# Patient Record
Sex: Female | Born: 1945
Health system: Southern US, Community
[De-identification: ages and names within clinical notes are randomized; demographics above are authoritative.]

## PROBLEM LIST (undated history)

## (undated) DIAGNOSIS — D649 Anemia, unspecified: Secondary | ICD-10-CM

## (undated) DIAGNOSIS — F32A Depression, unspecified: Secondary | ICD-10-CM

## (undated) DIAGNOSIS — I809 Phlebitis and thrombophlebitis of unspecified site: Secondary | ICD-10-CM

## (undated) DIAGNOSIS — G4700 Insomnia, unspecified: Secondary | ICD-10-CM

## (undated) DIAGNOSIS — J3089 Other allergic rhinitis: Secondary | ICD-10-CM

## (undated) DIAGNOSIS — M1990 Unspecified osteoarthritis, unspecified site: Secondary | ICD-10-CM

## (undated) DIAGNOSIS — F329 Major depressive disorder, single episode, unspecified: Secondary | ICD-10-CM

## (undated) DIAGNOSIS — E559 Vitamin D deficiency, unspecified: Secondary | ICD-10-CM

## (undated) DIAGNOSIS — J189 Pneumonia, unspecified organism: Secondary | ICD-10-CM

## (undated) DIAGNOSIS — R0600 Dyspnea, unspecified: Secondary | ICD-10-CM

## (undated) DIAGNOSIS — I10 Essential (primary) hypertension: Secondary | ICD-10-CM

## (undated) DIAGNOSIS — K219 Gastro-esophageal reflux disease without esophagitis: Secondary | ICD-10-CM

## (undated) DIAGNOSIS — K5790 Diverticulosis of intestine, part unspecified, without perforation or abscess without bleeding: Secondary | ICD-10-CM

## (undated) DIAGNOSIS — Z8719 Personal history of other diseases of the digestive system: Secondary | ICD-10-CM

## (undated) HISTORY — PX: ABDOMINAL HYSTERECTOMY: SHX81

## (undated) HISTORY — PX: EYE SURGERY: SHX253

## (undated) HISTORY — PX: FOOT NEUROMA SURGERY: SHX646

## (undated) HISTORY — PX: OTHER SURGICAL HISTORY: SHX169

## (undated) HISTORY — PX: JOINT REPLACEMENT: SHX530

## (undated) HISTORY — PX: TONSILLECTOMY: SUR1361

## (undated) HISTORY — PX: SHOULDER ARTHROSCOPY W/ ROTATOR CUFF REPAIR: SHX2400

## (undated) HISTORY — PX: CORRECTION HAMMER TOE: SUR317

## (undated) HISTORY — PX: APPENDECTOMY: SHX54

---

## 2005-08-21 ENCOUNTER — Ambulatory Visit: Payer: Self-pay | Admitting: Internal Medicine

## 2006-08-23 ENCOUNTER — Ambulatory Visit: Payer: Self-pay | Admitting: Internal Medicine

## 2006-08-27 ENCOUNTER — Ambulatory Visit: Payer: Self-pay | Admitting: Gastroenterology

## 2007-08-26 ENCOUNTER — Ambulatory Visit: Payer: Self-pay | Admitting: Internal Medicine

## 2008-10-07 ENCOUNTER — Ambulatory Visit: Payer: Self-pay | Admitting: General Practice

## 2008-10-21 ENCOUNTER — Inpatient Hospital Stay: Payer: Self-pay | Admitting: General Practice

## 2008-12-16 ENCOUNTER — Ambulatory Visit: Payer: Self-pay | Admitting: Internal Medicine

## 2009-02-12 ENCOUNTER — Other Ambulatory Visit: Payer: Self-pay | Admitting: Internal Medicine

## 2009-07-28 ENCOUNTER — Ambulatory Visit: Payer: Self-pay | Admitting: Internal Medicine

## 2009-11-12 ENCOUNTER — Ambulatory Visit: Payer: Self-pay | Admitting: Gastroenterology

## 2009-12-02 ENCOUNTER — Ambulatory Visit: Payer: Self-pay | Admitting: Gastroenterology

## 2010-01-13 ENCOUNTER — Ambulatory Visit: Payer: Self-pay | Admitting: Gastroenterology

## 2010-08-04 ENCOUNTER — Ambulatory Visit: Payer: Self-pay | Admitting: Internal Medicine

## 2011-03-27 ENCOUNTER — Ambulatory Visit: Payer: Self-pay | Admitting: Gastroenterology

## 2011-03-28 LAB — PATHOLOGY REPORT

## 2011-08-15 ENCOUNTER — Ambulatory Visit: Payer: Self-pay | Admitting: Internal Medicine

## 2012-08-15 ENCOUNTER — Ambulatory Visit: Payer: Self-pay | Admitting: Internal Medicine

## 2013-08-18 ENCOUNTER — Ambulatory Visit: Payer: Self-pay

## 2013-11-25 ENCOUNTER — Ambulatory Visit (INDEPENDENT_AMBULATORY_CARE_PROVIDER_SITE_OTHER): Payer: Medicare Other | Admitting: Podiatry

## 2013-11-25 ENCOUNTER — Encounter: Payer: Self-pay | Admitting: Podiatry

## 2013-11-25 ENCOUNTER — Ambulatory Visit (INDEPENDENT_AMBULATORY_CARE_PROVIDER_SITE_OTHER): Payer: Medicare Other

## 2013-11-25 VITALS — BP 141/81 | HR 81 | Resp 16 | Ht 66.0 in | Wt 207.0 lb

## 2013-11-25 DIAGNOSIS — M79673 Pain in unspecified foot: Secondary | ICD-10-CM

## 2013-11-25 DIAGNOSIS — S93409A Sprain of unspecified ligament of unspecified ankle, initial encounter: Secondary | ICD-10-CM

## 2013-11-25 DIAGNOSIS — M79609 Pain in unspecified limb: Secondary | ICD-10-CM

## 2013-11-25 DIAGNOSIS — M7750 Other enthesopathy of unspecified foot: Secondary | ICD-10-CM

## 2013-11-25 DIAGNOSIS — M779 Enthesopathy, unspecified: Secondary | ICD-10-CM

## 2013-11-25 MED ORDER — TRIAMCINOLONE ACETONIDE 10 MG/ML IJ SUSP
10.0000 mg | Freq: Once | INTRAMUSCULAR | Status: AC
  Administered 2013-11-25: 10 mg

## 2013-11-25 NOTE — Progress Notes (Signed)
   Subjective:    Patient ID: Elizabeth Reed, female    DOB: Apr 01, 1946, 68 y.o.   MRN: [id number]  HPI Comments: Its my right foot. It swells and hurts all over. Different days it hurts in different places. Its been going on for 2 months. Its gotten worse over the months. i havent done anything for my foot.  Foot Pain      Review of Systems  Musculoskeletal:       Joint pain  All other systems reviewed and are negative.      Objective:   Physical Exam        Assessment & Plan:

## 2013-11-25 NOTE — Progress Notes (Signed)
Subjective:     Patient ID: Elizabeth Reed, female   DOB: 14-Feb-1946, 68 y.o.   MRN: [id number]  HPI patient states that the top of the right foot has been very sore and she's been walking differently and now the arch hurts and he ankle hurts on her right foot  Review of Systems     Objective:   Physical Exam Neurovascular status unchanged with inflammation and pain on the dorsum of the right midtarsal joint discomfort in the right fascia and discomfort in the sinus tarsi right with range of motion adequate but restricted and muscle strength within normal limits    Assessment:     Arthritis with inflammation dorsal right foot with probable compensation creating plantar fasciitis sprained ankle    Plan:     H&P and x-ray reviewed. Injected the dorsal tissue 3 mg Kenalog 5 mg Xylocaine Marcaine mixture dispensed fascially brace to lift the plantar arch and discussed possible new orthotics if symptoms persist

## 2013-12-16 ENCOUNTER — Ambulatory Visit: Payer: Medicare Other | Admitting: Podiatry

## 2014-08-19 ENCOUNTER — Ambulatory Visit: Payer: Self-pay

## 2015-04-19 ENCOUNTER — Other Ambulatory Visit: Payer: Self-pay | Admitting: Infectious Diseases

## 2015-04-19 ENCOUNTER — Ambulatory Visit
Admission: RE | Admit: 2015-04-19 | Discharge: 2015-04-19 | Disposition: A | Discharge status: Home / Self Care | Payer: PPO | Source: Ambulatory Visit | Attending: Infectious Diseases | Admitting: Infectious Diseases

## 2015-04-19 DIAGNOSIS — K8020 Calculus of gallbladder without cholecystitis without obstruction: Secondary | ICD-10-CM | POA: Insufficient documentation

## 2015-04-19 DIAGNOSIS — K8050 Calculus of bile duct without cholangitis or cholecystitis without obstruction: Secondary | ICD-10-CM | POA: Diagnosis present

## 2015-04-19 DIAGNOSIS — I10 Essential (primary) hypertension: Secondary | ICD-10-CM | POA: Diagnosis present

## 2015-05-04 ENCOUNTER — Encounter
Admission: RE | Admit: 2015-05-04 | Discharge: 2015-05-04 | Disposition: A | Discharge status: Home / Self Care | Payer: PPO | Source: Ambulatory Visit | Attending: Surgery | Admitting: Surgery

## 2015-05-04 DIAGNOSIS — Z01818 Encounter for other preprocedural examination: Secondary | ICD-10-CM | POA: Diagnosis present

## 2015-05-04 DIAGNOSIS — K8020 Calculus of gallbladder without cholecystitis without obstruction: Secondary | ICD-10-CM | POA: Insufficient documentation

## 2015-05-04 HISTORY — DX: Gastro-esophageal reflux disease without esophagitis: K21.9

## 2015-05-04 HISTORY — DX: Personal history of other diseases of the digestive system: Z87.19

## 2015-05-04 HISTORY — DX: Unspecified osteoarthritis, unspecified site: M19.90

## 2015-05-04 HISTORY — DX: Phlebitis and thrombophlebitis of unspecified site: I80.9

## 2015-05-04 HISTORY — DX: Essential (primary) hypertension: I10

## 2015-05-04 LAB — POTASSIUM: Potassium: 4.2 mmol/L (ref 3.5–5.1)

## 2015-05-04 NOTE — Patient Instructions (Signed)
  Your procedure is scheduled on: May 11, 2015(Tuesday) Report to Day Surgery. To find out your arrival time please call [phone] between 1PM - 3PM on May 10, 2015(Monday)  Remember: Instructions that are not followed completely may result in serious medical risk, up to and including death, or upon the discretion of your surgeon and anesthesiologist your surgery may need to be rescheduled.    __x__ 1. Do not eat food or drink liquids after midnight. No gum chewing or hard candies.     ____ 2. No Alcohol for 24 hours before or after surgery.   ____ 3. Bring all medications with you on the day of surgery if instructed.    __x__ 4. Notify your doctor if there is any change in your medical condition     (cold, fever, infections).     Do not wear jewelry, make-up, hairpins, clips or nail polish.  Do not wear lotions, powders, or perfumes. You may wear deodorant.  Do not shave 48 hours prior to surgery. Men may shave face and neck.  Do not bring valuables to the hospital.    [facility] is not responsible for any belongings or valuables.               Contacts, dentures or bridgework may not be worn into surgery.  Leave your suitcase in the car. After surgery it may be brought to your room.  For patients admitted to the hospital, discharge time is determined by your                treatment team.   Patients discharged the day of surgery will not be allowed to drive home.   Please read over the following fact sheets that you were given:   Surgical Site Infection Prevention   _x___ Take these medicines the morning of surgery with A SIP OF WATER:    1. Pantoprazole (Pantoprazole at bedtime Monday night)  2.   3.   4.  5.  6.  ____ Fleet Enema (as directed)   __x__ Use CHG Soap as directed  ____ Use inhalers on the day of surgery  ____ Stop metformin 2 days prior to surgery    ____ Take 1/2 of usual insulin dose the night before surgery and none on the morning of  surgery.   __x__ Stop Coumadin/Plavix/aspirin on (Patient has stopped Aspirin)  ____ Stop Anti-inflammatories on    ____ Stop supplements until after surgery.    ____ Bring C-Pap to the hospital.

## 2015-05-11 ENCOUNTER — Encounter: Payer: Self-pay | Admitting: *Deleted

## 2015-05-11 ENCOUNTER — Ambulatory Visit
Admission: RE | Admit: 2015-05-11 | Discharge: 2015-05-11 | Disposition: A | Discharge status: Home / Self Care | Payer: PPO | Source: Ambulatory Visit | Attending: Surgery | Admitting: Surgery

## 2015-05-11 ENCOUNTER — Ambulatory Visit: Payer: PPO | Admitting: Anesthesiology

## 2015-05-11 ENCOUNTER — Encounter
Admission: RE | Disposition: A | Discharge status: Home / Self Care | Payer: Self-pay | Source: Ambulatory Visit | Attending: Surgery

## 2015-05-11 ENCOUNTER — Ambulatory Visit: Payer: PPO

## 2015-05-11 DIAGNOSIS — K449 Diaphragmatic hernia without obstruction or gangrene: Secondary | ICD-10-CM | POA: Insufficient documentation

## 2015-05-11 DIAGNOSIS — G4700 Insomnia, unspecified: Secondary | ICD-10-CM | POA: Diagnosis not present

## 2015-05-11 DIAGNOSIS — K8010 Calculus of gallbladder with chronic cholecystitis without obstruction: Secondary | ICD-10-CM | POA: Insufficient documentation

## 2015-05-11 DIAGNOSIS — Z96653 Presence of artificial knee joint, bilateral: Secondary | ICD-10-CM | POA: Insufficient documentation

## 2015-05-11 DIAGNOSIS — F329 Major depressive disorder, single episode, unspecified: Secondary | ICD-10-CM | POA: Diagnosis not present

## 2015-05-11 DIAGNOSIS — K219 Gastro-esophageal reflux disease without esophagitis: Secondary | ICD-10-CM | POA: Insufficient documentation

## 2015-05-11 DIAGNOSIS — K8020 Calculus of gallbladder without cholecystitis without obstruction: Secondary | ICD-10-CM

## 2015-05-11 DIAGNOSIS — M1990 Unspecified osteoarthritis, unspecified site: Secondary | ICD-10-CM | POA: Diagnosis not present

## 2015-05-11 DIAGNOSIS — Z7982 Long term (current) use of aspirin: Secondary | ICD-10-CM | POA: Diagnosis not present

## 2015-05-11 DIAGNOSIS — E559 Vitamin D deficiency, unspecified: Secondary | ICD-10-CM | POA: Diagnosis not present

## 2015-05-11 DIAGNOSIS — I10 Essential (primary) hypertension: Secondary | ICD-10-CM | POA: Diagnosis not present

## 2015-05-11 HISTORY — PX: CHOLECYSTECTOMY: SHX55

## 2015-05-11 SURGERY — LAPAROSCOPIC CHOLECYSTECTOMY
Anesthesia: General | Wound class: Clean

## 2015-05-11 MED ORDER — SUCCINYLCHOLINE CHLORIDE 20 MG/ML IJ SOLN
INTRAMUSCULAR | Status: DC | PRN
Start: 2015-05-11 — End: 2015-05-11
  Administered 2015-05-11: 100 mg via INTRAVENOUS

## 2015-05-11 MED ORDER — OXYCODONE HCL 5 MG/5ML PO SOLN: 5.0000 mg | Freq: Once | ORAL | Status: DC | PRN

## 2015-05-11 MED ORDER — LIDOCAINE HCL (CARDIAC) 20 MG/ML IV SOLN
INTRAVENOUS | Status: DC | PRN
Start: 2015-05-11 — End: 2015-05-11
  Administered 2015-05-11: 40 mg via INTRAVENOUS

## 2015-05-11 MED ORDER — DEXTROSE 5 % IV SOLN
2.0000 g | INTRAVENOUS | Status: DC | PRN
  Administered 2015-05-11: 2 g via INTRAVENOUS

## 2015-05-11 MED ORDER — SODIUM CHLORIDE 0.9 % IV SOLN
INTRAVENOUS | Status: DC | PRN
  Administered 2015-05-11: 1 mL via INTRAMUSCULAR

## 2015-05-11 MED ORDER — SUGAMMADEX SODIUM 200 MG/2ML IV SOLN
INTRAVENOUS | Status: DC | PRN
  Administered 2015-05-11: 149.6 mg via INTRAVENOUS

## 2015-05-11 MED ORDER — DEXTROSE 5 % IV SOLN
2.0000 g | Freq: Once | INTRAVENOUS | Status: AC
  Administered 2015-05-11: 2 g via INTRAVENOUS
  Filled 2015-05-11: qty 2

## 2015-05-11 MED ORDER — HYDROCODONE-ACETAMINOPHEN 5-325 MG PO TABS
1.0000 | ORAL_TABLET | ORAL | Status: DC | PRN
  Administered 2015-05-11: 1 via ORAL

## 2015-05-11 MED ORDER — HYDROCODONE-ACETAMINOPHEN 5-325 MG PO TABS: 1.0000 | ORAL_TABLET | ORAL | Status: DC | PRN

## 2015-05-11 MED ORDER — ROCURONIUM BROMIDE 100 MG/10ML IV SOLN
INTRAVENOUS | Status: DC | PRN
  Administered 2015-05-11: 5 mg via INTRAVENOUS
  Administered 2015-05-11: 35 mg via INTRAVENOUS

## 2015-05-11 MED ORDER — ONDANSETRON HCL 4 MG/2ML IJ SOLN: 4.0000 mg | Freq: Once | INTRAMUSCULAR | Status: DC | PRN

## 2015-05-11 MED ORDER — OXYCODONE HCL 5 MG PO TABS: 5.0000 mg | ORAL_TABLET | Freq: Once | ORAL | Status: DC | PRN

## 2015-05-11 MED ORDER — FENTANYL CITRATE (PF) 100 MCG/2ML IJ SOLN: 25.0000 ug | INTRAMUSCULAR | Status: DC | PRN

## 2015-05-11 MED ORDER — PHENYLEPHRINE HCL 10 MG/ML IJ SOLN
INTRAMUSCULAR | Status: DC | PRN
  Administered 2015-05-11 (×2): 100 ug via INTRAVENOUS

## 2015-05-11 MED ORDER — MIDAZOLAM HCL 2 MG/2ML IJ SOLN
INTRAMUSCULAR | Status: DC | PRN
  Administered 2015-05-11: 1 mg via INTRAVENOUS

## 2015-05-11 MED ORDER — PROPOFOL 10 MG/ML IV BOLUS
INTRAVENOUS | Status: DC | PRN
  Administered 2015-05-11: 140 mg via INTRAVENOUS

## 2015-05-11 MED ORDER — FENTANYL CITRATE (PF) 100 MCG/2ML IJ SOLN
25.0000 ug | INTRAMUSCULAR | Status: DC | PRN
  Administered 2015-05-11 (×4): 25 ug via INTRAVENOUS

## 2015-05-11 MED ORDER — LACTATED RINGERS IV SOLN
INTRAVENOUS | Status: DC
  Administered 2015-05-11 (×2): via INTRAVENOUS

## 2015-05-11 MED ORDER — HEPARIN SODIUM (PORCINE) 5000 UNIT/ML IJ SOLN
INTRAMUSCULAR | Status: AC
  Filled 2015-05-11: qty 1

## 2015-05-11 MED ORDER — ONDANSETRON HCL 4 MG/2ML IJ SOLN
INTRAMUSCULAR | Status: DC | PRN
  Administered 2015-05-11: 4 mg via INTRAVENOUS

## 2015-05-11 MED ORDER — HYDROCODONE-ACETAMINOPHEN 5-325 MG PO TABS
ORAL_TABLET | ORAL | Status: AC
  Filled 2015-05-11: qty 1

## 2015-05-11 MED ORDER — FENTANYL CITRATE (PF) 100 MCG/2ML IJ SOLN
INTRAMUSCULAR | Status: DC | PRN
  Administered 2015-05-11: 50 ug via INTRAVENOUS
  Administered 2015-05-11: 25 ug via INTRAVENOUS
  Administered 2015-05-11: 50 ug via INTRAVENOUS

## 2015-05-11 MED ORDER — DEXAMETHASONE SODIUM PHOSPHATE 4 MG/ML IJ SOLN
INTRAMUSCULAR | Status: DC | PRN
  Administered 2015-05-11: 8 mg via INTRAVENOUS

## 2015-05-11 MED ORDER — FENTANYL CITRATE (PF) 100 MCG/2ML IJ SOLN
INTRAMUSCULAR | Status: AC
  Administered 2015-05-11: 25 ug via INTRAVENOUS
  Filled 2015-05-11: qty 2

## 2015-05-11 SURGICAL SUPPLY — 35 items
APPLIER CLIP ROT 10 11.4 M/L (STAPLE) ×3
CANISTER SUCT 1200ML W/VALVE (MISCELLANEOUS) ×3 IMPLANT
CANNULA DILATOR 10 W/SLV (CANNULA) ×2 IMPLANT
CANNULA DILATOR 10MM W/SLV (CANNULA) ×1
CATH REDDICK CHOLANGI 4FR 50CM (CATHETERS) ×3 IMPLANT
CHLORAPREP W/TINT 26ML (MISCELLANEOUS) ×3 IMPLANT
CLIP APPLIE ROT 10 11.4 M/L (STAPLE) ×1 IMPLANT
CLOSURE WOUND 1/2 X4 (GAUZE/BANDAGES/DRESSINGS) ×1
DRAPE SHEET LG 3/4 BI-LAMINATE (DRAPES) ×3 IMPLANT
GAUZE SPONGE 4X4 12PLY STRL (GAUZE/BANDAGES/DRESSINGS) ×3 IMPLANT
GLOVE BIO SURGEON STRL SZ7.5 (GLOVE) ×3 IMPLANT
GOWN STRL REUS W/ TWL LRG LVL3 (GOWN DISPOSABLE) ×4 IMPLANT
GOWN STRL REUS W/TWL LRG LVL3 (GOWN DISPOSABLE) ×8
IRRIGATION STRYKERFLOW (MISCELLANEOUS) ×1 IMPLANT
IRRIGATOR STRYKERFLOW (MISCELLANEOUS) ×3
IV NS 1000ML (IV SOLUTION) ×2
IV NS 1000ML BAXH (IV SOLUTION) ×1 IMPLANT
KIT RM TURNOVER STRD PROC AR (KITS) ×3 IMPLANT
LABEL OR SOLS (LABEL) ×3 IMPLANT
NEEDLE FILTER BLUNT 18X 1/2SAF (NEEDLE) ×2
NEEDLE FILTER BLUNT 18X1 1/2 (NEEDLE) ×1 IMPLANT
NS IRRIG 500ML POUR BTL (IV SOLUTION) ×3 IMPLANT
PACK LAP CHOLECYSTECTOMY (MISCELLANEOUS) ×3 IMPLANT
PAD GROUND ADULT SPLIT (MISCELLANEOUS) ×3 IMPLANT
SCISSORS METZENBAUM CVD 33 (INSTRUMENTS) ×3 IMPLANT
SEAL FOR SCOPE WARMER C3101 (MISCELLANEOUS) ×3 IMPLANT
SLEEVE ENDOPATH XCEL 5M (ENDOMECHANICALS) ×3 IMPLANT
STRIP CLOSURE SKIN 1/2X4 (GAUZE/BANDAGES/DRESSINGS) ×2 IMPLANT
SUT CHROMIC 5 0 RB 1 27 (SUTURE) ×3 IMPLANT
SUT VIC AB 0 CT2 27 (SUTURE) IMPLANT
SYR 3ML LL SCALE MARK (SYRINGE) ×3 IMPLANT
TROCAR XCEL NON-BLD 11X100MML (ENDOMECHANICALS) ×3 IMPLANT
TROCAR XCEL NON-BLD 5MMX100MML (ENDOMECHANICALS) ×3 IMPLANT
TUBING INSUFFLATOR HI FLOW (MISCELLANEOUS) ×3 IMPLANT
WATER STERILE IRR 1000ML POUR (IV SOLUTION) ×3 IMPLANT

## 2015-05-11 NOTE — Anesthesia Postprocedure Evaluation (Signed)
  Anesthesia Post-op Note  Patient: Elizabeth Reed  Procedure(s) Performed: Procedure(s): LAPAROSCOPIC CHOLECYSTECTOMY (N/A)  Anesthesia type:General ETT  Patient location: PACU  Post pain: Pain level controlled  Post assessment: Post-op Vital signs reviewed, Patient's Cardiovascular Status Stable, Respiratory Function Stable, Patent Airway and No signs of Nausea or vomiting  Post vital signs: Reviewed and stable  Last Vitals:  Filed Vitals:   05/11/15 1234  BP: 132/73  Pulse: 67  Temp: 37.8 C  Resp: 9    Level of consciousness: awake, alert  and patient cooperative  Complications: No apparent anesthesia complications

## 2015-05-11 NOTE — Anesthesia Procedure Notes (Signed)
Procedure Name: Intubation Date/Time: 05/11/2015 10:03 AM Performed by: Allean Found Pre-anesthesia Checklist: Patient identified, Emergency Drugs available, Suction available, Patient being monitored and Timeout performed Patient Re-evaluated:Patient Re-evaluated prior to inductionOxygen Delivery Method: Circle system utilized Preoxygenation: Pre-oxygenation with 100% oxygen Intubation Type: IV induction Laryngoscope Size: Mac and 3 Grade View: Grade I Tube type: Oral Number of attempts: 1 Airway Equipment and Method: Stylet Placement Confirmation: ETT inserted through vocal cords under direct vision,  positive ETCO2 and breath sounds checked- equal and bilateral Secured at: 21 cm Tube secured with: Tape Dental Injury: Teeth and Oropharynx as per pre-operative assessment

## 2015-05-11 NOTE — Discharge Instructions (Addendum)
Take Tylenol or Norco if needed for pain. Resume aspirin on Thursday. Remove dressings Wednesday. May shower Thursday. Avoid straining and heavy lifting in the first week after surgery.   AMBULATORY SURGERY  DISCHARGE INSTRUCTIONS   1) The drugs that you were given will stay in your system until tomorrow so for the next 24 hours you should not:  A) Drive an automobile B) Make any legal decisions C) Drink any alcoholic beverage   2) You may resume regular meals tomorrow.  Today it is better to start with liquids and gradually work up to solid foods.  You may eat anything you prefer, but it is better to start with liquids, then soup and crackers, and gradually work up to solid foods.   3) Please notify your doctor immediately if you have any unusual bleeding, trouble breathing, redness and pain at the surgery site, drainage, fever, or pain not relieved by medication.    4) Additional Instructions:        Please contact your physician with any problems or Same Day Surgery at [phone], Monday through Friday 6 am to 4 pm, or [place] at [facility] number at [phone].

## 2015-05-11 NOTE — Op Note (Signed)
OPERATIVE REPORT  PREOPERATIVE DIAGNOSIS:  Chronic cholecystitis cholelithiasis  POSTOPERATIVE DIAGNOSIS: Chronic cholecystitis cholelithiasis  PROCEDURE: Laparoscopic cholecystectomy with cholangiogram  ANESTHESIA: General  SURGEON: Rochel Brome M.D.  INDICATIONS: She has a history of severe epigastric pains and ultrasound findings of gallstones    With the patient on the operating table in the supine position under general endotracheal anesthesia the abdomen was prepared with ChloraPrep solution and draped in a sterile manner. A short incision was made in the inferior aspect of the umbilicus and carried down to the deep fascia which was grasped with a laryngeal hook. The Veress needle was inserted aspirated and irrigated with a saline solution. The abdominal cavity was insufflated with carbon dioxide. The Veress needle was removed. The 10 mm cannula was inserted. The 10 mm 0 laparoscope was inserted to view the peritoneal cavity.  Another incision was made in the epigastrium slightly to the right of the midline to introduce an 11 mm cannula. 2 incisions were made in the lateral aspect of the right upper quadrant to introduce 2   5 mm cannulas. The gallbladder was retracted towards the right shoulder.   The gallbladder neck was retracted inferiorly and laterally.  The porta hepatis was identified. The gallbladder was mobilized with incision of the visceral peritoneum. The cystic duct was dissected free from surrounding structures. The cystic artery was dissected free from surrounding structures. A critical view of safety was demonstrated  An Endo Clip was placed across the cystic duct adjacent to the gallbladder neck. An incision was made in the cystic duct to introduce a Reddick catheter. The Reddick catheter would not thread into the cystic duct. Therefore a cholangiogram was not done The Reddick catheter was removed. The cystic duct was doubly ligated with endoclips and divided. The cystic  artery was controlled with double endoclips and divided. The gallbladder was dissected free from the liver with use of hook and cautery and blunt dissection. Bleeding was minimal and hemostasis was intact. The gallbladder was delivered up through the infraumbilical incision opened and suctioned. The gallbladder  was removed and identified tiny stones. The gallbladder was submitted in formalin for routine pathology. The skin incisions were closed with interrupted 5-0 chromic subcutaneous suture benzoin and Steri-Strips. Gauze dressings were applied with paper tape.  The patient appeared to be in satisfactory condition and was prepared for transfer to the recovery room  [place].D.

## 2015-05-11 NOTE — H&P (Signed)
  She reports no change in condition since the day of the office visit.  I discussed the plan for laparoscopic cholecystectomy.

## 2015-05-11 NOTE — Transfer of Care (Signed)
Immediate Anesthesia Transfer of Care Note  Patient: Elizabeth Reed  Procedure(s) Performed: Procedure(s): LAPAROSCOPIC CHOLECYSTECTOMY (N/A)  Patient Location: PACU  Anesthesia Type:General  Level of Consciousness: awake  Airway & Oxygen Therapy: Patient Spontanous Breathing  Post-op Assessment: Report given to RN and Post -op Vital signs reviewed and stable  Post vital signs: stable  Last Vitals:  Filed Vitals:   05/11/15 1149  BP: 158/90  Pulse: 74  Temp: 35.6 C  Resp: 14    Complications: No apparent anesthesia complications

## 2015-05-11 NOTE — Anesthesia Preprocedure Evaluation (Addendum)
Anesthesia Evaluation  Patient identified by MRN, date of birth, ID band Patient awake    Reviewed: Allergy & Precautions, H&P , NPO status , Patient's Chart, lab work & pertinent test results  History of Anesthesia Complications Negative for: history of anesthetic complications  Airway Mallampati: II  TM Distance: <3 FB Neck ROM: limited    Dental  (+) Poor Dentition, Missing, Upper Dentures, Lower Dentures   Pulmonary neg pulmonary ROS, neg shortness of breath,    Pulmonary exam normal breath sounds clear to auscultation       Cardiovascular Exercise Tolerance: Good hypertension, (-) angina(-) DOE Normal cardiovascular exam Rhythm:regular Rate:Normal     Neuro/Psych negative neurological ROS  negative psych ROS   GI/Hepatic Neg liver ROS, hiatal hernia, GERD  Controlled,  Endo/Other  negative endocrine ROS  Renal/GU negative Renal ROS  negative genitourinary   Musculoskeletal   Abdominal   Peds  Hematology negative hematology ROS (+)   Anesthesia Other Findings Past Medical History:   Hypertension                                                 GERD (gastroesophageal reflux disease)                         Comment:erosive esophagus   History of hiatal hernia                                     Arthritis                                                    Blood clot associated with vein wall inflammat*                Comment:Left Leg  Past Surgical History:   ABDOMINAL HYSTERECTOMY                                        JOINT REPLACEMENT                               Bilateral 2004, 2010     Comment:Total Knee Replacement   APPENDECTOMY                                                  TONSILLECTOMY                                                 SHOULDER ARTHROSCOPY W/ ROTATOR CUFF REPAIR     Left             BMI    Body Mass Index   26.64 kg/m 2      Reproductive/Obstetrics  negative OB ROS                             Anesthesia Physical Anesthesia Plan  ASA: III  Anesthesia Plan: General ETT   Post-op Pain Management:    Induction:   Airway Management Planned: Oral ETT  Additional Equipment:   Intra-op Plan:   Post-operative Plan: Extubation in OR  Informed Consent: I have reviewed the patients History and Physical, chart, labs and discussed the procedure including the risks, benefits and alternatives for the proposed anesthesia with the patient or authorized representative who has indicated his/her understanding and acceptance.   Dental Advisory Given  Plan Discussed with: Anesthesiologist, CRNA and Surgeon  Anesthesia Plan Comments:        Anesthesia Quick Evaluation

## 2015-05-13 LAB — SURGICAL PATHOLOGY

## 2015-07-27 DIAGNOSIS — M5134 Other intervertebral disc degeneration, thoracic region: Secondary | ICD-10-CM | POA: Diagnosis not present

## 2015-07-27 DIAGNOSIS — M9903 Segmental and somatic dysfunction of lumbar region: Secondary | ICD-10-CM | POA: Diagnosis not present

## 2015-07-27 DIAGNOSIS — M9902 Segmental and somatic dysfunction of thoracic region: Secondary | ICD-10-CM | POA: Diagnosis not present

## 2015-07-27 DIAGNOSIS — M5416 Radiculopathy, lumbar region: Secondary | ICD-10-CM | POA: Diagnosis not present

## 2015-08-24 DIAGNOSIS — M9903 Segmental and somatic dysfunction of lumbar region: Secondary | ICD-10-CM | POA: Diagnosis not present

## 2015-08-24 DIAGNOSIS — M5134 Other intervertebral disc degeneration, thoracic region: Secondary | ICD-10-CM | POA: Diagnosis not present

## 2015-08-24 DIAGNOSIS — M5416 Radiculopathy, lumbar region: Secondary | ICD-10-CM | POA: Diagnosis not present

## 2015-08-24 DIAGNOSIS — M9902 Segmental and somatic dysfunction of thoracic region: Secondary | ICD-10-CM | POA: Diagnosis not present

## 2015-08-27 ENCOUNTER — Other Ambulatory Visit: Payer: Self-pay | Admitting: Infectious Diseases

## 2015-08-27 DIAGNOSIS — Z1231 Encounter for screening mammogram for malignant neoplasm of breast: Secondary | ICD-10-CM

## 2015-09-02 ENCOUNTER — Ambulatory Visit
Admission: RE | Admit: 2015-09-02 | Discharge: 2015-09-02 | Disposition: A | Discharge status: Home / Self Care | Payer: PPO | Source: Ambulatory Visit | Attending: Infectious Diseases | Admitting: Infectious Diseases

## 2015-09-02 DIAGNOSIS — Z1231 Encounter for screening mammogram for malignant neoplasm of breast: Secondary | ICD-10-CM | POA: Insufficient documentation

## 2015-09-21 DIAGNOSIS — M9902 Segmental and somatic dysfunction of thoracic region: Secondary | ICD-10-CM | POA: Diagnosis not present

## 2015-09-21 DIAGNOSIS — M5134 Other intervertebral disc degeneration, thoracic region: Secondary | ICD-10-CM | POA: Diagnosis not present

## 2015-09-21 DIAGNOSIS — M9903 Segmental and somatic dysfunction of lumbar region: Secondary | ICD-10-CM | POA: Diagnosis not present

## 2015-09-21 DIAGNOSIS — M5416 Radiculopathy, lumbar region: Secondary | ICD-10-CM | POA: Diagnosis not present

## 2015-10-19 DIAGNOSIS — M9903 Segmental and somatic dysfunction of lumbar region: Secondary | ICD-10-CM | POA: Diagnosis not present

## 2015-10-19 DIAGNOSIS — M5416 Radiculopathy, lumbar region: Secondary | ICD-10-CM | POA: Diagnosis not present

## 2015-10-19 DIAGNOSIS — M5134 Other intervertebral disc degeneration, thoracic region: Secondary | ICD-10-CM | POA: Diagnosis not present

## 2015-10-19 DIAGNOSIS — M9902 Segmental and somatic dysfunction of thoracic region: Secondary | ICD-10-CM | POA: Diagnosis not present

## 2015-11-16 DIAGNOSIS — M5416 Radiculopathy, lumbar region: Secondary | ICD-10-CM | POA: Diagnosis not present

## 2015-11-16 DIAGNOSIS — M9903 Segmental and somatic dysfunction of lumbar region: Secondary | ICD-10-CM | POA: Diagnosis not present

## 2015-11-16 DIAGNOSIS — M9902 Segmental and somatic dysfunction of thoracic region: Secondary | ICD-10-CM | POA: Diagnosis not present

## 2015-11-16 DIAGNOSIS — M5134 Other intervertebral disc degeneration, thoracic region: Secondary | ICD-10-CM | POA: Diagnosis not present

## 2015-12-14 DIAGNOSIS — M5134 Other intervertebral disc degeneration, thoracic region: Secondary | ICD-10-CM | POA: Diagnosis not present

## 2015-12-14 DIAGNOSIS — M9902 Segmental and somatic dysfunction of thoracic region: Secondary | ICD-10-CM | POA: Diagnosis not present

## 2015-12-14 DIAGNOSIS — M5416 Radiculopathy, lumbar region: Secondary | ICD-10-CM | POA: Diagnosis not present

## 2015-12-14 DIAGNOSIS — M9903 Segmental and somatic dysfunction of lumbar region: Secondary | ICD-10-CM | POA: Diagnosis not present

## 2016-01-11 DIAGNOSIS — M5416 Radiculopathy, lumbar region: Secondary | ICD-10-CM | POA: Diagnosis not present

## 2016-01-11 DIAGNOSIS — M9903 Segmental and somatic dysfunction of lumbar region: Secondary | ICD-10-CM | POA: Diagnosis not present

## 2016-01-11 DIAGNOSIS — M5134 Other intervertebral disc degeneration, thoracic region: Secondary | ICD-10-CM | POA: Diagnosis not present

## 2016-01-11 DIAGNOSIS — M9902 Segmental and somatic dysfunction of thoracic region: Secondary | ICD-10-CM | POA: Diagnosis not present

## 2016-01-31 DIAGNOSIS — H40039 Anatomical narrow angle, unspecified eye: Secondary | ICD-10-CM | POA: Diagnosis not present

## 2016-02-04 DIAGNOSIS — H40039 Anatomical narrow angle, unspecified eye: Secondary | ICD-10-CM | POA: Diagnosis not present

## 2016-02-11 DIAGNOSIS — H35372 Puckering of macula, left eye: Secondary | ICD-10-CM | POA: Diagnosis not present

## 2016-02-15 DIAGNOSIS — M9903 Segmental and somatic dysfunction of lumbar region: Secondary | ICD-10-CM | POA: Diagnosis not present

## 2016-02-15 DIAGNOSIS — M9902 Segmental and somatic dysfunction of thoracic region: Secondary | ICD-10-CM | POA: Diagnosis not present

## 2016-02-15 DIAGNOSIS — M5134 Other intervertebral disc degeneration, thoracic region: Secondary | ICD-10-CM | POA: Diagnosis not present

## 2016-02-15 DIAGNOSIS — M5416 Radiculopathy, lumbar region: Secondary | ICD-10-CM | POA: Diagnosis not present

## 2016-03-14 DIAGNOSIS — M9903 Segmental and somatic dysfunction of lumbar region: Secondary | ICD-10-CM | POA: Diagnosis not present

## 2016-03-14 DIAGNOSIS — M9902 Segmental and somatic dysfunction of thoracic region: Secondary | ICD-10-CM | POA: Diagnosis not present

## 2016-03-14 DIAGNOSIS — M5134 Other intervertebral disc degeneration, thoracic region: Secondary | ICD-10-CM | POA: Diagnosis not present

## 2016-03-14 DIAGNOSIS — M5416 Radiculopathy, lumbar region: Secondary | ICD-10-CM | POA: Diagnosis not present

## 2016-04-11 DIAGNOSIS — M5134 Other intervertebral disc degeneration, thoracic region: Secondary | ICD-10-CM | POA: Diagnosis not present

## 2016-04-11 DIAGNOSIS — M9902 Segmental and somatic dysfunction of thoracic region: Secondary | ICD-10-CM | POA: Diagnosis not present

## 2016-04-11 DIAGNOSIS — M9903 Segmental and somatic dysfunction of lumbar region: Secondary | ICD-10-CM | POA: Diagnosis not present

## 2016-04-11 DIAGNOSIS — M5416 Radiculopathy, lumbar region: Secondary | ICD-10-CM | POA: Diagnosis not present

## 2016-05-09 DIAGNOSIS — M5134 Other intervertebral disc degeneration, thoracic region: Secondary | ICD-10-CM | POA: Diagnosis not present

## 2016-05-09 DIAGNOSIS — M9903 Segmental and somatic dysfunction of lumbar region: Secondary | ICD-10-CM | POA: Diagnosis not present

## 2016-05-09 DIAGNOSIS — M9902 Segmental and somatic dysfunction of thoracic region: Secondary | ICD-10-CM | POA: Diagnosis not present

## 2016-05-09 DIAGNOSIS — M5416 Radiculopathy, lumbar region: Secondary | ICD-10-CM | POA: Diagnosis not present

## 2016-06-06 DIAGNOSIS — M5134 Other intervertebral disc degeneration, thoracic region: Secondary | ICD-10-CM | POA: Diagnosis not present

## 2016-06-06 DIAGNOSIS — M9902 Segmental and somatic dysfunction of thoracic region: Secondary | ICD-10-CM | POA: Diagnosis not present

## 2016-06-06 DIAGNOSIS — M9903 Segmental and somatic dysfunction of lumbar region: Secondary | ICD-10-CM | POA: Diagnosis not present

## 2016-06-06 DIAGNOSIS — M5416 Radiculopathy, lumbar region: Secondary | ICD-10-CM | POA: Diagnosis not present

## 2016-06-26 DIAGNOSIS — E559 Vitamin D deficiency, unspecified: Secondary | ICD-10-CM | POA: Diagnosis not present

## 2016-06-26 DIAGNOSIS — Z0000 Encounter for general adult medical examination without abnormal findings: Secondary | ICD-10-CM | POA: Diagnosis not present

## 2016-06-26 DIAGNOSIS — I10 Essential (primary) hypertension: Secondary | ICD-10-CM | POA: Diagnosis not present

## 2016-06-26 DIAGNOSIS — Z1231 Encounter for screening mammogram for malignant neoplasm of breast: Secondary | ICD-10-CM | POA: Diagnosis not present

## 2016-07-03 DIAGNOSIS — I10 Essential (primary) hypertension: Secondary | ICD-10-CM | POA: Diagnosis not present

## 2016-07-03 DIAGNOSIS — K219 Gastro-esophageal reflux disease without esophagitis: Secondary | ICD-10-CM | POA: Diagnosis not present

## 2016-07-03 DIAGNOSIS — E559 Vitamin D deficiency, unspecified: Secondary | ICD-10-CM | POA: Diagnosis not present

## 2016-07-03 DIAGNOSIS — Z0000 Encounter for general adult medical examination without abnormal findings: Secondary | ICD-10-CM | POA: Diagnosis not present

## 2016-07-04 DIAGNOSIS — M9902 Segmental and somatic dysfunction of thoracic region: Secondary | ICD-10-CM | POA: Diagnosis not present

## 2016-07-04 DIAGNOSIS — M5134 Other intervertebral disc degeneration, thoracic region: Secondary | ICD-10-CM | POA: Diagnosis not present

## 2016-07-04 DIAGNOSIS — M5416 Radiculopathy, lumbar region: Secondary | ICD-10-CM | POA: Diagnosis not present

## 2016-07-04 DIAGNOSIS — M9903 Segmental and somatic dysfunction of lumbar region: Secondary | ICD-10-CM | POA: Diagnosis not present

## 2016-07-25 ENCOUNTER — Other Ambulatory Visit: Payer: Self-pay | Admitting: Infectious Diseases

## 2016-07-25 DIAGNOSIS — Z1231 Encounter for screening mammogram for malignant neoplasm of breast: Secondary | ICD-10-CM

## 2016-08-29 DIAGNOSIS — M9902 Segmental and somatic dysfunction of thoracic region: Secondary | ICD-10-CM | POA: Diagnosis not present

## 2016-08-29 DIAGNOSIS — M5416 Radiculopathy, lumbar region: Secondary | ICD-10-CM | POA: Diagnosis not present

## 2016-08-29 DIAGNOSIS — M5134 Other intervertebral disc degeneration, thoracic region: Secondary | ICD-10-CM | POA: Diagnosis not present

## 2016-08-29 DIAGNOSIS — M9903 Segmental and somatic dysfunction of lumbar region: Secondary | ICD-10-CM | POA: Diagnosis not present

## 2016-09-07 ENCOUNTER — Ambulatory Visit
Admission: RE | Admit: 2016-09-07 | Discharge: 2016-09-07 | Disposition: A | Discharge status: Home / Self Care | Payer: PPO | Source: Ambulatory Visit | Attending: Infectious Diseases | Admitting: Infectious Diseases

## 2016-09-07 DIAGNOSIS — Z1231 Encounter for screening mammogram for malignant neoplasm of breast: Secondary | ICD-10-CM | POA: Insufficient documentation

## 2016-10-24 DIAGNOSIS — M5416 Radiculopathy, lumbar region: Secondary | ICD-10-CM | POA: Diagnosis not present

## 2016-10-24 DIAGNOSIS — M5134 Other intervertebral disc degeneration, thoracic region: Secondary | ICD-10-CM | POA: Diagnosis not present

## 2016-10-24 DIAGNOSIS — M9902 Segmental and somatic dysfunction of thoracic region: Secondary | ICD-10-CM | POA: Diagnosis not present

## 2016-10-24 DIAGNOSIS — M9903 Segmental and somatic dysfunction of lumbar region: Secondary | ICD-10-CM | POA: Diagnosis not present

## 2016-11-08 DIAGNOSIS — K219 Gastro-esophageal reflux disease without esophagitis: Secondary | ICD-10-CM | POA: Diagnosis not present

## 2016-11-08 DIAGNOSIS — Z1211 Encounter for screening for malignant neoplasm of colon: Secondary | ICD-10-CM | POA: Diagnosis not present

## 2016-11-21 DIAGNOSIS — M9902 Segmental and somatic dysfunction of thoracic region: Secondary | ICD-10-CM | POA: Diagnosis not present

## 2016-11-21 DIAGNOSIS — M5416 Radiculopathy, lumbar region: Secondary | ICD-10-CM | POA: Diagnosis not present

## 2016-11-21 DIAGNOSIS — M5134 Other intervertebral disc degeneration, thoracic region: Secondary | ICD-10-CM | POA: Diagnosis not present

## 2016-11-21 DIAGNOSIS — M9903 Segmental and somatic dysfunction of lumbar region: Secondary | ICD-10-CM | POA: Diagnosis not present

## 2016-12-19 DIAGNOSIS — M9903 Segmental and somatic dysfunction of lumbar region: Secondary | ICD-10-CM | POA: Diagnosis not present

## 2016-12-19 DIAGNOSIS — M5416 Radiculopathy, lumbar region: Secondary | ICD-10-CM | POA: Diagnosis not present

## 2016-12-19 DIAGNOSIS — M9902 Segmental and somatic dysfunction of thoracic region: Secondary | ICD-10-CM | POA: Diagnosis not present

## 2016-12-19 DIAGNOSIS — M5134 Other intervertebral disc degeneration, thoracic region: Secondary | ICD-10-CM | POA: Diagnosis not present

## 2017-01-01 DIAGNOSIS — M9903 Segmental and somatic dysfunction of lumbar region: Secondary | ICD-10-CM | POA: Diagnosis not present

## 2017-01-01 DIAGNOSIS — M5416 Radiculopathy, lumbar region: Secondary | ICD-10-CM | POA: Diagnosis not present

## 2017-01-01 DIAGNOSIS — M5134 Other intervertebral disc degeneration, thoracic region: Secondary | ICD-10-CM | POA: Diagnosis not present

## 2017-01-01 DIAGNOSIS — M9902 Segmental and somatic dysfunction of thoracic region: Secondary | ICD-10-CM | POA: Diagnosis not present

## 2017-01-03 DIAGNOSIS — M5416 Radiculopathy, lumbar region: Secondary | ICD-10-CM | POA: Diagnosis not present

## 2017-01-03 DIAGNOSIS — M9903 Segmental and somatic dysfunction of lumbar region: Secondary | ICD-10-CM | POA: Diagnosis not present

## 2017-01-03 DIAGNOSIS — M5134 Other intervertebral disc degeneration, thoracic region: Secondary | ICD-10-CM | POA: Diagnosis not present

## 2017-01-03 DIAGNOSIS — M9902 Segmental and somatic dysfunction of thoracic region: Secondary | ICD-10-CM | POA: Diagnosis not present

## 2017-02-12 DIAGNOSIS — H2511 Age-related nuclear cataract, right eye: Secondary | ICD-10-CM | POA: Diagnosis not present

## 2017-02-27 DIAGNOSIS — M9905 Segmental and somatic dysfunction of pelvic region: Secondary | ICD-10-CM | POA: Diagnosis not present

## 2017-02-27 DIAGNOSIS — M9903 Segmental and somatic dysfunction of lumbar region: Secondary | ICD-10-CM | POA: Diagnosis not present

## 2017-02-27 DIAGNOSIS — M5136 Other intervertebral disc degeneration, lumbar region: Secondary | ICD-10-CM | POA: Diagnosis not present

## 2017-02-27 DIAGNOSIS — M955 Acquired deformity of pelvis: Secondary | ICD-10-CM | POA: Diagnosis not present

## 2017-03-27 DIAGNOSIS — M9905 Segmental and somatic dysfunction of pelvic region: Secondary | ICD-10-CM | POA: Diagnosis not present

## 2017-03-27 DIAGNOSIS — M5136 Other intervertebral disc degeneration, lumbar region: Secondary | ICD-10-CM | POA: Diagnosis not present

## 2017-03-27 DIAGNOSIS — M955 Acquired deformity of pelvis: Secondary | ICD-10-CM | POA: Diagnosis not present

## 2017-03-27 DIAGNOSIS — M9903 Segmental and somatic dysfunction of lumbar region: Secondary | ICD-10-CM | POA: Diagnosis not present

## 2017-03-29 ENCOUNTER — Encounter: Payer: Self-pay | Admitting: *Deleted

## 2017-04-02 ENCOUNTER — Ambulatory Visit: Payer: PPO | Admitting: Anesthesiology

## 2017-04-02 ENCOUNTER — Encounter
Admission: RE | Disposition: A | Discharge status: Home / Self Care | Payer: Self-pay | Source: Ambulatory Visit | Attending: Gastroenterology

## 2017-04-02 ENCOUNTER — Ambulatory Visit
Admission: RE | Admit: 2017-04-02 | Discharge: 2017-04-02 | Disposition: A | Discharge status: Home / Self Care | Payer: PPO | Source: Ambulatory Visit | Attending: Gastroenterology | Admitting: Gastroenterology

## 2017-04-02 DIAGNOSIS — F329 Major depressive disorder, single episode, unspecified: Secondary | ICD-10-CM | POA: Insufficient documentation

## 2017-04-02 DIAGNOSIS — E559 Vitamin D deficiency, unspecified: Secondary | ICD-10-CM | POA: Diagnosis not present

## 2017-04-02 DIAGNOSIS — K5790 Diverticulosis of intestine, part unspecified, without perforation or abscess without bleeding: Secondary | ICD-10-CM | POA: Diagnosis not present

## 2017-04-02 DIAGNOSIS — G4700 Insomnia, unspecified: Secondary | ICD-10-CM | POA: Diagnosis not present

## 2017-04-02 DIAGNOSIS — Z1211 Encounter for screening for malignant neoplasm of colon: Secondary | ICD-10-CM | POA: Insufficient documentation

## 2017-04-02 DIAGNOSIS — Z86718 Personal history of other venous thrombosis and embolism: Secondary | ICD-10-CM | POA: Diagnosis not present

## 2017-04-02 DIAGNOSIS — M1990 Unspecified osteoarthritis, unspecified site: Secondary | ICD-10-CM | POA: Diagnosis not present

## 2017-04-02 DIAGNOSIS — Z7982 Long term (current) use of aspirin: Secondary | ICD-10-CM | POA: Insufficient documentation

## 2017-04-02 DIAGNOSIS — Z6830 Body mass index (BMI) 30.0-30.9, adult: Secondary | ICD-10-CM | POA: Insufficient documentation

## 2017-04-02 DIAGNOSIS — D123 Benign neoplasm of transverse colon: Secondary | ICD-10-CM | POA: Diagnosis not present

## 2017-04-02 DIAGNOSIS — K635 Polyp of colon: Secondary | ICD-10-CM | POA: Diagnosis not present

## 2017-04-02 DIAGNOSIS — I10 Essential (primary) hypertension: Secondary | ICD-10-CM | POA: Diagnosis not present

## 2017-04-02 DIAGNOSIS — E669 Obesity, unspecified: Secondary | ICD-10-CM | POA: Insufficient documentation

## 2017-04-02 DIAGNOSIS — K219 Gastro-esophageal reflux disease without esophagitis: Secondary | ICD-10-CM | POA: Insufficient documentation

## 2017-04-02 DIAGNOSIS — Z79899 Other long term (current) drug therapy: Secondary | ICD-10-CM | POA: Insufficient documentation

## 2017-04-02 DIAGNOSIS — K5730 Diverticulosis of large intestine without perforation or abscess without bleeding: Secondary | ICD-10-CM | POA: Diagnosis not present

## 2017-04-02 HISTORY — DX: Depression, unspecified: F32.A

## 2017-04-02 HISTORY — PX: COLONOSCOPY WITH PROPOFOL: SHX5780

## 2017-04-02 HISTORY — DX: Major depressive disorder, single episode, unspecified: F32.9

## 2017-04-02 HISTORY — DX: Insomnia, unspecified: G47.00

## 2017-04-02 HISTORY — DX: Vitamin D deficiency, unspecified: E55.9

## 2017-04-02 SURGERY — COLONOSCOPY WITH PROPOFOL
Anesthesia: General

## 2017-04-02 MED ORDER — PROPOFOL 10 MG/ML IV BOLUS
INTRAVENOUS | Status: DC | PRN
  Administered 2017-04-02: 20 mg via INTRAVENOUS
  Administered 2017-04-02: 30 mg via INTRAVENOUS

## 2017-04-02 MED ORDER — PROPOFOL 500 MG/50ML IV EMUL
INTRAVENOUS | Status: DC | PRN
  Administered 2017-04-02: 120 ug/kg/min via INTRAVENOUS

## 2017-04-02 MED ORDER — EPHEDRINE SULFATE 50 MG/ML IJ SOLN
INTRAMUSCULAR | Status: DC | PRN
  Administered 2017-04-02 (×2): 10 mg via INTRAVENOUS

## 2017-04-02 MED ORDER — PROPOFOL 10 MG/ML IV BOLUS
INTRAVENOUS | Status: AC
  Filled 2017-04-02: qty 20

## 2017-04-02 MED ORDER — LIDOCAINE HCL (PF) 2 % IJ SOLN
INTRAMUSCULAR | Status: AC
  Filled 2017-04-02: qty 2

## 2017-04-02 MED ORDER — SODIUM CHLORIDE 0.9 % IV SOLN
INTRAVENOUS | Status: DC
  Administered 2017-04-02: 1000 mL via INTRAVENOUS

## 2017-04-02 MED ORDER — EPHEDRINE SULFATE 50 MG/ML IJ SOLN
INTRAMUSCULAR | Status: AC
  Filled 2017-04-02: qty 1

## 2017-04-02 MED ORDER — SODIUM CHLORIDE 0.9 % IV SOLN: INTRAVENOUS | Status: DC

## 2017-04-02 NOTE — Op Note (Signed)
[facility] Patient Name: Elizabeth Reed Procedure Date: 04/02/2017 8:21 AM MRN: [id number] Account #: [id number] Date of Birth: Apr 02, 1946 Admit Type: Outpatient Age: 71 Room: [facility] ENDO ROOM 1 Gender: Female Note Status: Finalized Procedure:            Colonoscopy Indications:          Screening for colorectal malignant neoplasm Providers:            Lollie Sails, MD Referring MD:         Adrian Prows (Referring MD) Medicines:            Monitored Anesthesia Care Complications:        No immediate complications. Procedure:            Pre-Anesthesia Assessment:                       - ASA Grade Assessment: II - A patient with mild                        systemic disease.                       After obtaining informed consent, the colonoscope was                        passed under direct vision. Throughout the procedure,                        the patient's blood pressure, pulse, and oxygen                        saturations were monitored continuously. The                        Colonoscope was introduced through the anus and                        advanced to the the cecum, identified by appendiceal                        orifice and ileocecal valve. The colonoscopy was                        performed without difficulty. The patient tolerated the                        procedure well. The quality of the bowel preparation                        was good. Findings:      A 2 mm polyp was found in the hepatic flexure. The polyp was sessile.       The polyp was removed with a cold biopsy forceps. Resection and       retrieval were complete.      Multiple small-mouthed diverticula were found in the sigmoid colon,       descending colon, transverse colon and ascending colon.      The retroflexed view of the distal rectum and anal verge was normal and       showed no anal or rectal abnormalities.      The digital rectal exam was  normal. Impression:           - One 2 mm polyp at the hepatic flexure, removed with a                        cold biopsy forceps. Resected and retrieved.                       - Diverticulosis in the sigmoid colon, in the                        descending colon, in the transverse colon and in the                        ascending colon.                       - The distal rectum and anal verge are normal on                        retroflexion view. Recommendation:       - Discharge patient to home.                       - Telephone GI clinic for pathology results in 1 week. Procedure Code(s):    --- Professional ---                       [phone], Colonoscopy, flexible; with biopsy, single or                        multiple Diagnosis Code(s):    --- Professional ---                       Z12.11, Encounter for screening for malignant neoplasm                        of colon                       D12.3, Benign neoplasm of transverse colon (hepatic                        flexure or splenic flexure)                       K57.30, Diverticulosis of large intestine without                        perforation or abscess without bleeding CPT copyright 2016 American Medical Association. All rights reserved. The codes documented in this report are preliminary and upon coder review may  be revised to meet current compliance requirements. Lollie Sails, MD 04/02/2017 8:54:21 AM This report has been signed electronically. Number of Addenda: 0 Note Initiated On: 04/02/2017 8:21 AM Scope Withdrawal Time: 0 hours 8 minutes 53 seconds  Total Procedure Duration: 0 hours 19 minutes 49 seconds       [facility]

## 2017-04-02 NOTE — Anesthesia Postprocedure Evaluation (Signed)
Anesthesia Post Note  Patient: Elizabeth Reed  Procedure(s) Performed: Procedure(s) (LRB): COLONOSCOPY WITH PROPOFOL (N/A)  Patient location during evaluation: PACU Anesthesia Type: General Level of consciousness: awake Pain management: pain level controlled Vital Signs Assessment: post-procedure vital signs reviewed and stable Respiratory status: spontaneous breathing Cardiovascular status: stable Anesthetic complications: no     Last Vitals:  Vitals:   04/02/17 0916 04/02/17 0926  BP: 136/87 136/81  Pulse: 72 70  Resp: 19 20  Temp:    SpO2: 100% 100%    Last Pain:  Vitals:   04/02/17 0856  TempSrc: Tympanic                 VAN STAVEREN,Tenita Cue

## 2017-04-02 NOTE — Anesthesia Preprocedure Evaluation (Signed)
Anesthesia Evaluation  Patient identified by MRN, date of birth, ID band Patient awake    Reviewed: Allergy & Precautions, NPO status , Patient's Chart, lab work & pertinent test results  Airway Mallampati: II       Dental  (+) Upper Dentures, Lower Dentures   Pulmonary neg pulmonary ROS,    breath sounds clear to auscultation       Cardiovascular Exercise Tolerance: Good hypertension, Pt. on medications  Rhythm:Regular     Neuro/Psych Depression negative neurological ROS     GI/Hepatic Neg liver ROS, hiatal hernia, GERD  Medicated,  Endo/Other  negative endocrine ROS  Renal/GU negative Renal ROS  negative genitourinary   Musculoskeletal   Abdominal (+) + obese,   Peds negative pediatric ROS (+)  Hematology negative hematology ROS (+)   Anesthesia Other Findings   Reproductive/Obstetrics                             Anesthesia Physical Anesthesia Plan  ASA: II  Anesthesia Plan: General   Post-op Pain Management:    Induction: Intravenous  PONV Risk Score and Plan: 0  Airway Management Planned: Natural Airway and Nasal Cannula  Additional Equipment:   Intra-op Plan:   Post-operative Plan:   Informed Consent: I have reviewed the patients History and Physical, chart, labs and discussed the procedure including the risks, benefits and alternatives for the proposed anesthesia with the patient or authorized representative who has indicated his/her understanding and acceptance.     Plan Discussed with: Surgeon  Anesthesia Plan Comments:         Anesthesia Quick Evaluation

## 2017-04-02 NOTE — H&P (Signed)
Outpatient short stay form Pre-procedure 04/02/2017 8:24 AM Elizabeth Sails MD  Primary Physician: Dr. Adrian Prows  Reason for visit:  Screening colonoscopy  History of present illness:  Patient is a 71 year old female presenting today as above. She tolerated her prep well. She takes no blood thinning agents or aspirin products with the exception of 81 mg aspirin that has been held for several days.  Her last colonoscopy was about 10 years ago with a finding of diverticulosis.  Current Facility-Administered Medications:  .  0.9 %  sodium chloride infusion, , Intravenous, Continuous, Elizabeth Sails, MD, Last Rate: 20 mL/hr at 04/02/17 0731, 1,000 mL at 04/02/17 0731 .  0.9 %  sodium chloride infusion, , Intravenous, Continuous, Elizabeth Sails, MD  Prescriptions Prior to Admission  Medication Sig Dispense Refill Last Dose  . Acetaminophen (TYLENOL PO) Take 1,000 mg by mouth as needed.    Past Week at Unknown time  . aspirin 81 MG tablet Take 81 mg by mouth daily.   Past Week at Unknown time  . calcium carbonate (OSCAL) 1500 (600 Ca) MG TABS tablet Take by mouth 2 (two) times daily with a meal.   Past Week at Unknown time  . ergocalciferol (VITAMIN D2) 50000 UNITS capsule Take 50,000 Units by mouth. Every 2 weeks   Past Week at Unknown time  . hydrochlorothiazide (HYDRODIURIL) 25 MG tablet Take 25 mg by mouth daily.    04/01/2017 at Unknown time  . HYDROcodone-acetaminophen (NORCO) 5-325 MG tablet Take 1-2 tablets by mouth every 4 (four) hours as needed for moderate pain. 12 tablet 0 Past Week at Unknown time  . pantoprazole (PROTONIX) 40 MG tablet Take 40 mg by mouth daily.    04/01/2017 at Unknown time     No Known Allergies   Past Medical History:  Diagnosis Date  . Arthritis   . Blood clot associated with vein wall inflammation    Left Leg  . Depression   . GERD (gastroesophageal reflux disease)    erosive esophagus  . History of hiatal hernia   . Hypertension    . Insomnia   . Vitamin D deficiency     Review of systems:      Physical Exam    Heart and lungs: Regular rate and rhythm without rub or gallop, lungs are bilaterally clear.    HEENT: Normocephalic atraumatic eyes are anicteric    Other:     Pertinant exam for procedure: Soft nontender nondistended bowel sounds positive normoactive.    Planned proceedures: Colonoscopy and indicated procedures. I have discussed the risks benefits and complications of procedures to include not limited to bleeding, infection, perforation and the risk of sedation and the patient wishes to proceed.    Elizabeth Sails, MD Gastroenterology 04/02/2017  8:24 AM

## 2017-04-02 NOTE — Transfer of Care (Signed)
Immediate Anesthesia Transfer of Care Note  Patient: Elizabeth Reed  Procedure(s) Performed: Procedure(s): COLONOSCOPY WITH PROPOFOL (N/A)  Patient Location: PACU  Anesthesia Type:General  Level of Consciousness: awake  Airway & Oxygen Therapy: Patient Spontanous Breathing and Patient connected to nasal cannula oxygen  Post-op Assessment: Report given to RN and Post -op Vital signs reviewed and stable  Post vital signs: Reviewed  Last Vitals:  Vitals:   04/02/17 0718  BP: 134/86  Pulse: 84  Resp: 18  Temp: (!) 36.2 C  SpO2: 98%    Last Pain:  Vitals:   04/02/17 0718  TempSrc: Tympanic         Complications: No apparent anesthesia complications

## 2017-04-02 NOTE — Anesthesia Post-op Follow-up Note (Signed)
Anesthesia QCDR form completed.        

## 2017-04-03 ENCOUNTER — Encounter: Payer: Self-pay | Admitting: Gastroenterology

## 2017-04-03 DIAGNOSIS — G43109 Migraine with aura, not intractable, without status migrainosus: Secondary | ICD-10-CM | POA: Diagnosis not present

## 2017-04-03 DIAGNOSIS — H43811 Vitreous degeneration, right eye: Secondary | ICD-10-CM | POA: Diagnosis not present

## 2017-04-03 LAB — SURGICAL PATHOLOGY

## 2017-04-24 DIAGNOSIS — M9905 Segmental and somatic dysfunction of pelvic region: Secondary | ICD-10-CM | POA: Diagnosis not present

## 2017-04-24 DIAGNOSIS — M5136 Other intervertebral disc degeneration, lumbar region: Secondary | ICD-10-CM | POA: Diagnosis not present

## 2017-04-24 DIAGNOSIS — M955 Acquired deformity of pelvis: Secondary | ICD-10-CM | POA: Diagnosis not present

## 2017-04-24 DIAGNOSIS — M9903 Segmental and somatic dysfunction of lumbar region: Secondary | ICD-10-CM | POA: Diagnosis not present

## 2017-05-07 IMAGING — MG MM DIGITAL SCREENING BILAT W/ TOMO W/ CAD
8 of 12 series · 8 of 28 positions shown · non-contrast
Comparison: Previous exam(s).

CLINICAL DATA: Screening.

EXAM:
2D DIGITAL SCREENING BILATERAL MAMMOGRAM WITH CAD AND ADJUNCT TOMO

[R MLO synth-2D]
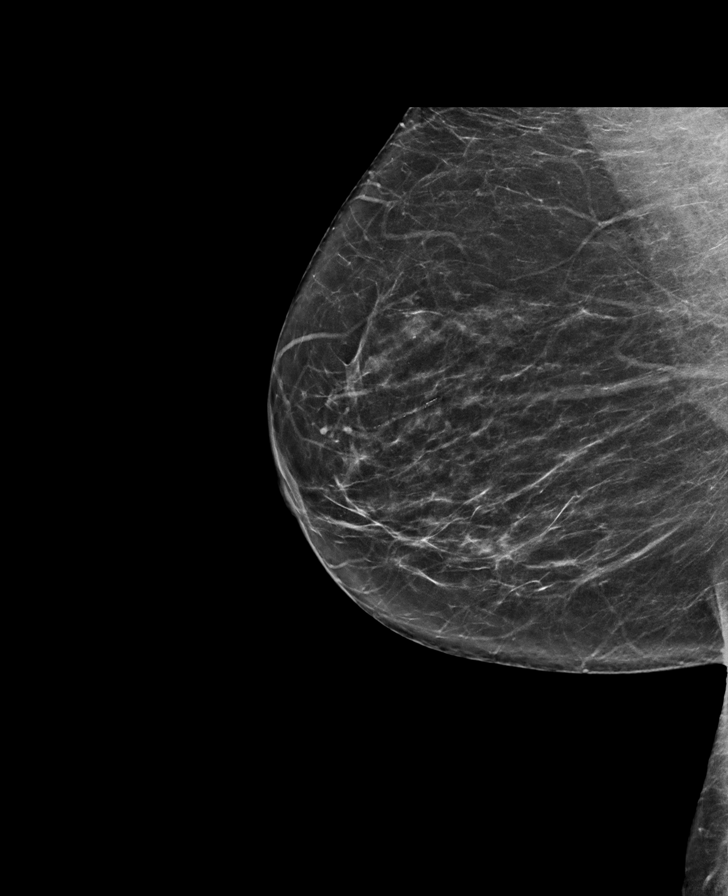

[R CC synth-2D]
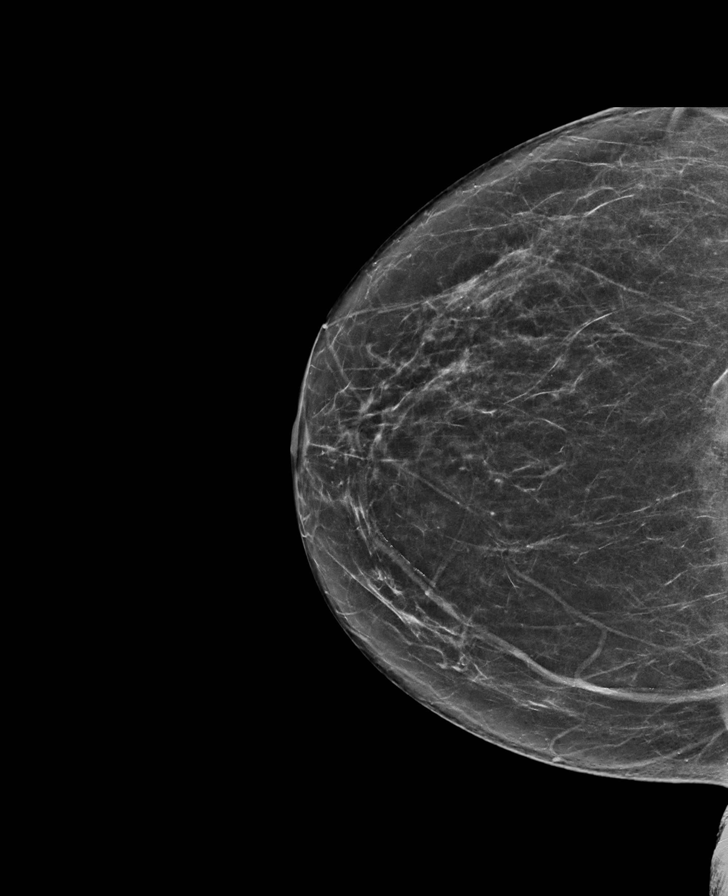

[L MLO]
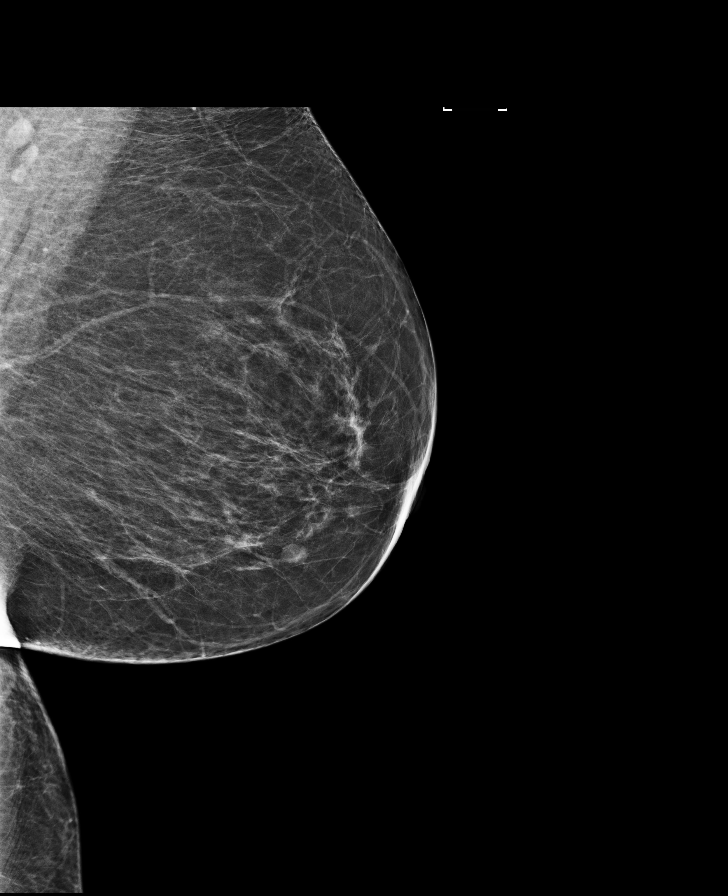

[R CC]
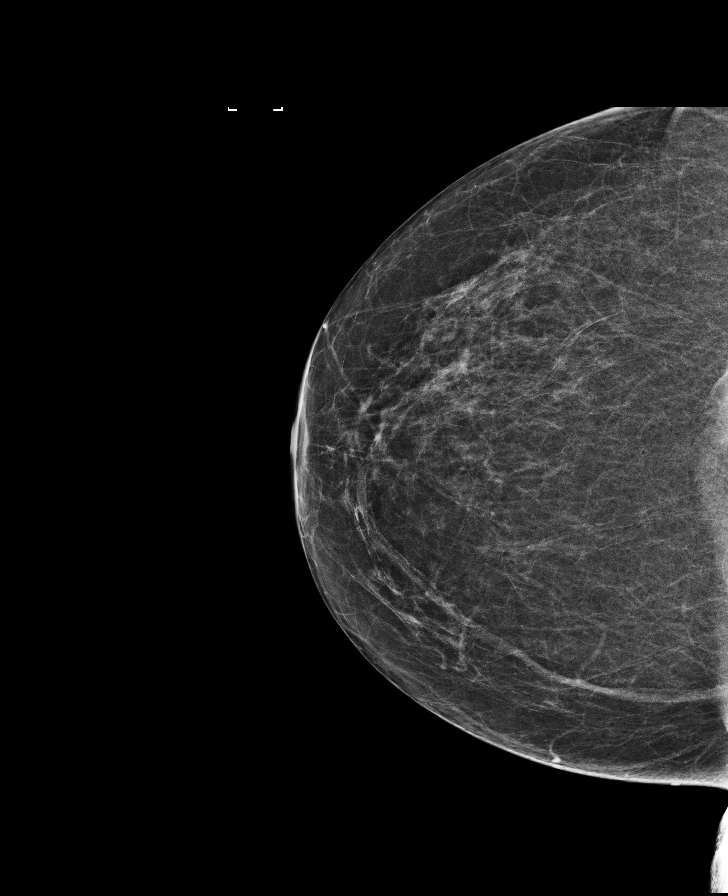

[L CC synth-2D]
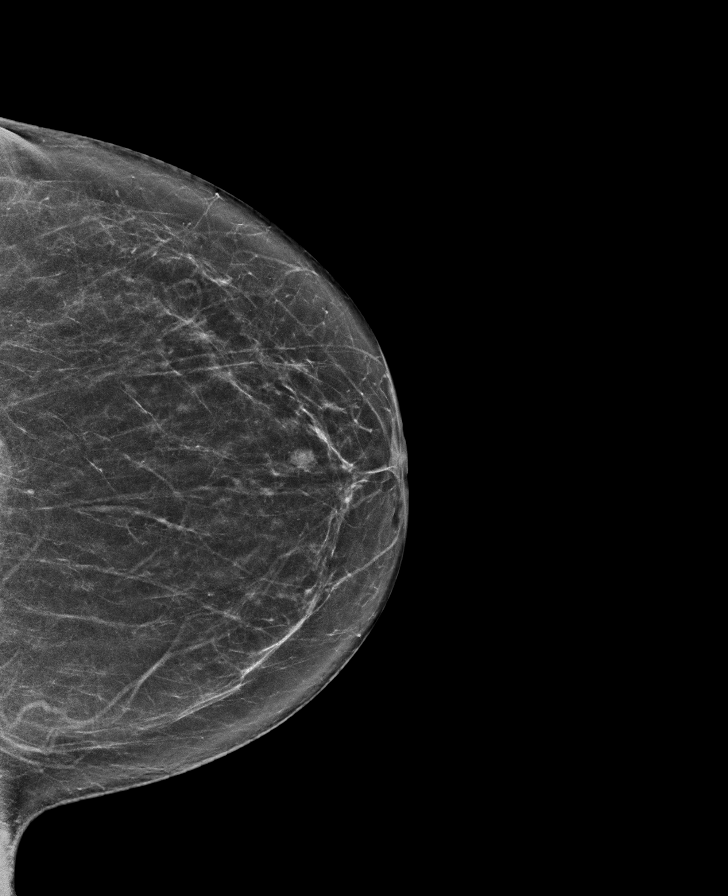

[R MLO]
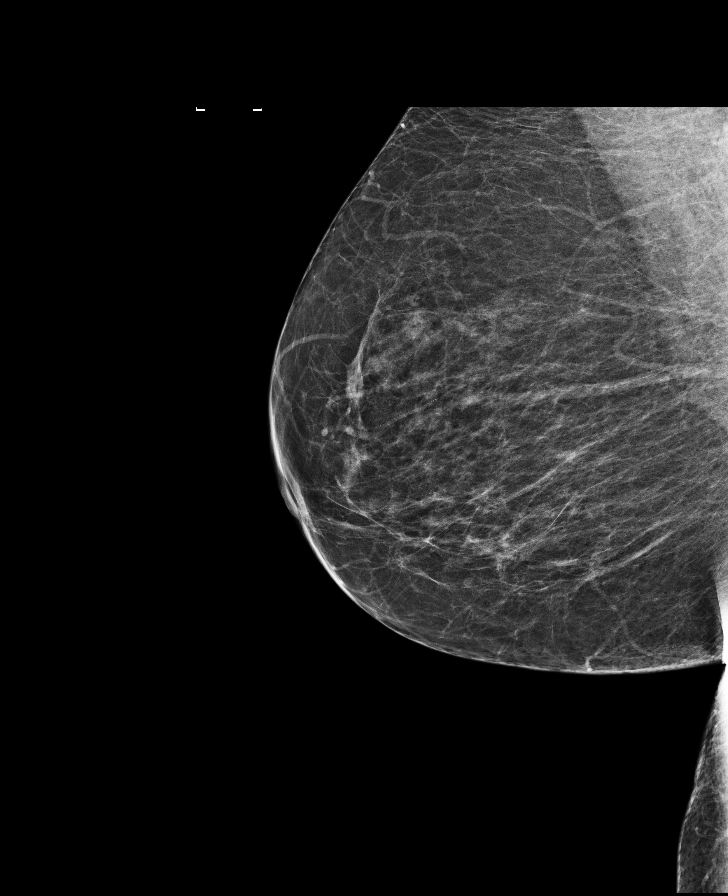

[L MLO synth-2D]
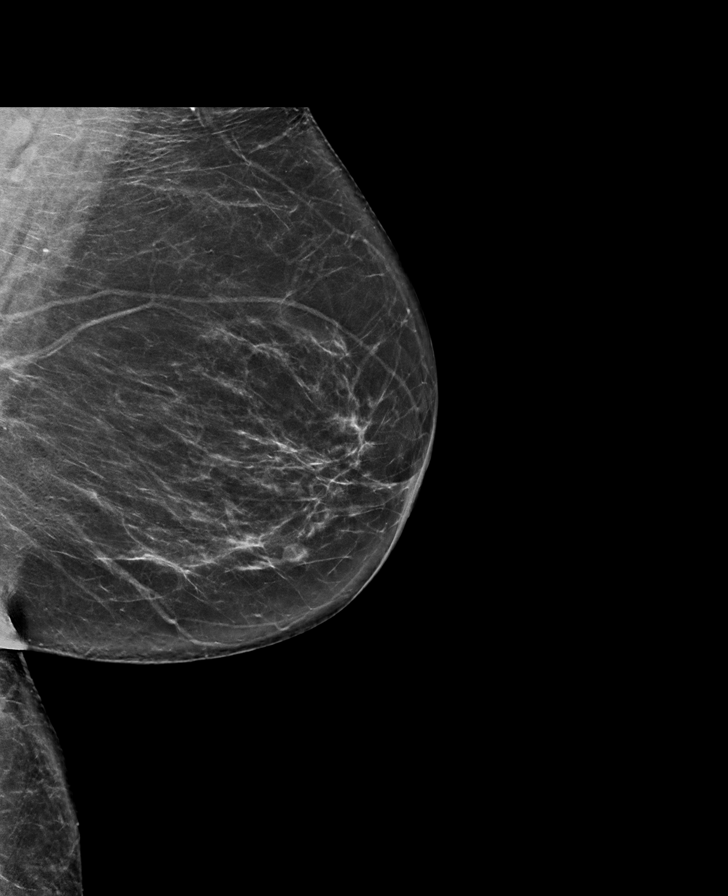

[L CC]
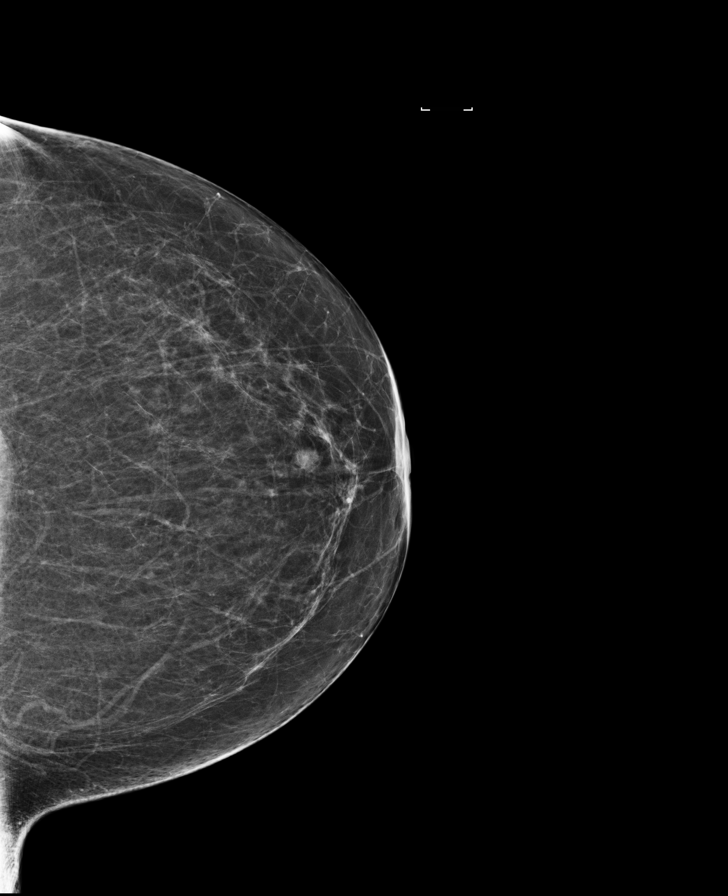

[8 of 28 positions shown; findings below may reference images not displayed]

ACR Breast Density Category b: There are scattered areas of
fibroglandular density.
FINDINGS: There are no findings suspicious for malignancy. Images were
processed with CAD.
IMPRESSION: No mammographic evidence of malignancy. A result letter of this
screening mammogram will be mailed directly to the patient.

RECOMMENDATION:
Screening mammogram in one year. (Code:97-6-RS4)

BI-RADS CATEGORY  1: Negative.

## 2017-06-27 DIAGNOSIS — I10 Essential (primary) hypertension: Secondary | ICD-10-CM | POA: Diagnosis not present

## 2017-06-27 DIAGNOSIS — K219 Gastro-esophageal reflux disease without esophagitis: Secondary | ICD-10-CM | POA: Diagnosis not present

## 2017-06-27 DIAGNOSIS — E559 Vitamin D deficiency, unspecified: Secondary | ICD-10-CM | POA: Diagnosis not present

## 2017-07-04 DIAGNOSIS — F5101 Primary insomnia: Secondary | ICD-10-CM | POA: Diagnosis not present

## 2017-07-04 DIAGNOSIS — E559 Vitamin D deficiency, unspecified: Secondary | ICD-10-CM | POA: Diagnosis not present

## 2017-07-04 DIAGNOSIS — Z0000 Encounter for general adult medical examination without abnormal findings: Secondary | ICD-10-CM | POA: Diagnosis not present

## 2017-07-04 DIAGNOSIS — I10 Essential (primary) hypertension: Secondary | ICD-10-CM | POA: Diagnosis not present

## 2017-07-04 DIAGNOSIS — K219 Gastro-esophageal reflux disease without esophagitis: Secondary | ICD-10-CM | POA: Diagnosis not present

## 2017-07-24 ENCOUNTER — Other Ambulatory Visit: Payer: Self-pay | Admitting: Infectious Diseases

## 2017-07-24 DIAGNOSIS — Z1231 Encounter for screening mammogram for malignant neoplasm of breast: Secondary | ICD-10-CM

## 2017-07-31 DIAGNOSIS — M9905 Segmental and somatic dysfunction of pelvic region: Secondary | ICD-10-CM | POA: Diagnosis not present

## 2017-07-31 DIAGNOSIS — M9903 Segmental and somatic dysfunction of lumbar region: Secondary | ICD-10-CM | POA: Diagnosis not present

## 2017-07-31 DIAGNOSIS — M5136 Other intervertebral disc degeneration, lumbar region: Secondary | ICD-10-CM | POA: Diagnosis not present

## 2017-07-31 DIAGNOSIS — M955 Acquired deformity of pelvis: Secondary | ICD-10-CM | POA: Diagnosis not present

## 2017-09-20 ENCOUNTER — Ambulatory Visit
Admission: RE | Admit: 2017-09-20 | Discharge: 2017-09-20 | Disposition: A | Discharge status: Home / Self Care | Payer: PPO | Source: Ambulatory Visit | Attending: Infectious Diseases | Admitting: Infectious Diseases

## 2017-09-20 DIAGNOSIS — Z1231 Encounter for screening mammogram for malignant neoplasm of breast: Secondary | ICD-10-CM | POA: Insufficient documentation

## 2017-09-25 DIAGNOSIS — M5136 Other intervertebral disc degeneration, lumbar region: Secondary | ICD-10-CM | POA: Diagnosis not present

## 2017-09-25 DIAGNOSIS — M955 Acquired deformity of pelvis: Secondary | ICD-10-CM | POA: Diagnosis not present

## 2017-09-25 DIAGNOSIS — M9903 Segmental and somatic dysfunction of lumbar region: Secondary | ICD-10-CM | POA: Diagnosis not present

## 2017-09-25 DIAGNOSIS — M9905 Segmental and somatic dysfunction of pelvic region: Secondary | ICD-10-CM | POA: Diagnosis not present

## 2017-10-23 DIAGNOSIS — M955 Acquired deformity of pelvis: Secondary | ICD-10-CM | POA: Diagnosis not present

## 2017-10-23 DIAGNOSIS — M9903 Segmental and somatic dysfunction of lumbar region: Secondary | ICD-10-CM | POA: Diagnosis not present

## 2017-10-23 DIAGNOSIS — M9905 Segmental and somatic dysfunction of pelvic region: Secondary | ICD-10-CM | POA: Diagnosis not present

## 2017-10-23 DIAGNOSIS — M5136 Other intervertebral disc degeneration, lumbar region: Secondary | ICD-10-CM | POA: Diagnosis not present

## 2017-11-20 DIAGNOSIS — M9905 Segmental and somatic dysfunction of pelvic region: Secondary | ICD-10-CM | POA: Diagnosis not present

## 2017-11-20 DIAGNOSIS — M5136 Other intervertebral disc degeneration, lumbar region: Secondary | ICD-10-CM | POA: Diagnosis not present

## 2017-11-20 DIAGNOSIS — M9903 Segmental and somatic dysfunction of lumbar region: Secondary | ICD-10-CM | POA: Diagnosis not present

## 2017-11-20 DIAGNOSIS — M955 Acquired deformity of pelvis: Secondary | ICD-10-CM | POA: Diagnosis not present

## 2017-12-18 DIAGNOSIS — M9903 Segmental and somatic dysfunction of lumbar region: Secondary | ICD-10-CM | POA: Diagnosis not present

## 2017-12-18 DIAGNOSIS — M5136 Other intervertebral disc degeneration, lumbar region: Secondary | ICD-10-CM | POA: Diagnosis not present

## 2017-12-18 DIAGNOSIS — M9905 Segmental and somatic dysfunction of pelvic region: Secondary | ICD-10-CM | POA: Diagnosis not present

## 2017-12-18 DIAGNOSIS — M955 Acquired deformity of pelvis: Secondary | ICD-10-CM | POA: Diagnosis not present

## 2018-01-15 DIAGNOSIS — M9905 Segmental and somatic dysfunction of pelvic region: Secondary | ICD-10-CM | POA: Diagnosis not present

## 2018-01-15 DIAGNOSIS — M955 Acquired deformity of pelvis: Secondary | ICD-10-CM | POA: Diagnosis not present

## 2018-01-15 DIAGNOSIS — M5136 Other intervertebral disc degeneration, lumbar region: Secondary | ICD-10-CM | POA: Diagnosis not present

## 2018-01-15 DIAGNOSIS — M9903 Segmental and somatic dysfunction of lumbar region: Secondary | ICD-10-CM | POA: Diagnosis not present

## 2018-02-12 DIAGNOSIS — M9905 Segmental and somatic dysfunction of pelvic region: Secondary | ICD-10-CM | POA: Diagnosis not present

## 2018-02-12 DIAGNOSIS — M9903 Segmental and somatic dysfunction of lumbar region: Secondary | ICD-10-CM | POA: Diagnosis not present

## 2018-02-12 DIAGNOSIS — M5136 Other intervertebral disc degeneration, lumbar region: Secondary | ICD-10-CM | POA: Diagnosis not present

## 2018-02-12 DIAGNOSIS — M955 Acquired deformity of pelvis: Secondary | ICD-10-CM | POA: Diagnosis not present

## 2018-02-21 DIAGNOSIS — H2513 Age-related nuclear cataract, bilateral: Secondary | ICD-10-CM | POA: Diagnosis not present

## 2018-03-12 DIAGNOSIS — M9903 Segmental and somatic dysfunction of lumbar region: Secondary | ICD-10-CM | POA: Diagnosis not present

## 2018-03-12 DIAGNOSIS — M5136 Other intervertebral disc degeneration, lumbar region: Secondary | ICD-10-CM | POA: Diagnosis not present

## 2018-03-12 DIAGNOSIS — M9905 Segmental and somatic dysfunction of pelvic region: Secondary | ICD-10-CM | POA: Diagnosis not present

## 2018-03-12 DIAGNOSIS — M955 Acquired deformity of pelvis: Secondary | ICD-10-CM | POA: Diagnosis not present

## 2018-03-22 DIAGNOSIS — M79675 Pain in left toe(s): Secondary | ICD-10-CM | POA: Diagnosis not present

## 2018-04-09 DIAGNOSIS — M955 Acquired deformity of pelvis: Secondary | ICD-10-CM | POA: Diagnosis not present

## 2018-04-09 DIAGNOSIS — M9905 Segmental and somatic dysfunction of pelvic region: Secondary | ICD-10-CM | POA: Diagnosis not present

## 2018-04-09 DIAGNOSIS — M5136 Other intervertebral disc degeneration, lumbar region: Secondary | ICD-10-CM | POA: Diagnosis not present

## 2018-04-09 DIAGNOSIS — M9903 Segmental and somatic dysfunction of lumbar region: Secondary | ICD-10-CM | POA: Diagnosis not present

## 2018-05-07 DIAGNOSIS — M955 Acquired deformity of pelvis: Secondary | ICD-10-CM | POA: Diagnosis not present

## 2018-05-07 DIAGNOSIS — M5136 Other intervertebral disc degeneration, lumbar region: Secondary | ICD-10-CM | POA: Diagnosis not present

## 2018-05-07 DIAGNOSIS — M9903 Segmental and somatic dysfunction of lumbar region: Secondary | ICD-10-CM | POA: Diagnosis not present

## 2018-05-07 DIAGNOSIS — M9905 Segmental and somatic dysfunction of pelvic region: Secondary | ICD-10-CM | POA: Diagnosis not present

## 2018-06-04 DIAGNOSIS — M9905 Segmental and somatic dysfunction of pelvic region: Secondary | ICD-10-CM | POA: Diagnosis not present

## 2018-06-04 DIAGNOSIS — M955 Acquired deformity of pelvis: Secondary | ICD-10-CM | POA: Diagnosis not present

## 2018-06-04 DIAGNOSIS — M9903 Segmental and somatic dysfunction of lumbar region: Secondary | ICD-10-CM | POA: Diagnosis not present

## 2018-06-04 DIAGNOSIS — M5136 Other intervertebral disc degeneration, lumbar region: Secondary | ICD-10-CM | POA: Diagnosis not present

## 2018-06-05 DIAGNOSIS — H2512 Age-related nuclear cataract, left eye: Secondary | ICD-10-CM | POA: Diagnosis not present

## 2018-06-06 ENCOUNTER — Encounter: Payer: Self-pay | Admitting: *Deleted

## 2018-06-18 ENCOUNTER — Ambulatory Visit: Payer: PPO | Admitting: Anesthesiology

## 2018-06-18 ENCOUNTER — Other Ambulatory Visit: Payer: Self-pay

## 2018-06-18 ENCOUNTER — Ambulatory Visit
Admission: RE | Admit: 2018-06-18 | Discharge: 2018-06-18 | Disposition: A | Discharge status: Home / Self Care | Payer: PPO | Source: Ambulatory Visit | Attending: Ophthalmology | Admitting: Ophthalmology

## 2018-06-18 ENCOUNTER — Encounter
Admission: RE | Disposition: A | Discharge status: Home / Self Care | Payer: Self-pay | Source: Ambulatory Visit | Attending: Ophthalmology

## 2018-06-18 ENCOUNTER — Encounter: Payer: Self-pay | Admitting: Anesthesiology

## 2018-06-18 DIAGNOSIS — F329 Major depressive disorder, single episode, unspecified: Secondary | ICD-10-CM | POA: Diagnosis not present

## 2018-06-18 DIAGNOSIS — K219 Gastro-esophageal reflux disease without esophagitis: Secondary | ICD-10-CM | POA: Diagnosis not present

## 2018-06-18 DIAGNOSIS — I10 Essential (primary) hypertension: Secondary | ICD-10-CM | POA: Diagnosis not present

## 2018-06-18 DIAGNOSIS — H2512 Age-related nuclear cataract, left eye: Secondary | ICD-10-CM | POA: Insufficient documentation

## 2018-06-18 DIAGNOSIS — M1990 Unspecified osteoarthritis, unspecified site: Secondary | ICD-10-CM | POA: Diagnosis not present

## 2018-06-18 HISTORY — DX: Dyspnea, unspecified: R06.00

## 2018-06-18 HISTORY — PX: CATARACT EXTRACTION W/PHACO: SHX586

## 2018-06-18 HISTORY — DX: Other allergic rhinitis: J30.89

## 2018-06-18 HISTORY — DX: Pneumonia, unspecified organism: J18.9

## 2018-06-18 HISTORY — DX: Diverticulosis of intestine, part unspecified, without perforation or abscess without bleeding: K57.90

## 2018-06-18 HISTORY — DX: Anemia, unspecified: D64.9

## 2018-06-18 SURGERY — PHACOEMULSIFICATION, CATARACT, WITH IOL INSERTION
Anesthesia: Monitor Anesthesia Care | Site: Eye | Laterality: Left

## 2018-06-18 MED ORDER — MIDAZOLAM HCL 2 MG/2ML IJ SOLN
INTRAMUSCULAR | Status: DC | PRN
  Administered 2018-06-18 (×2): 1 mg via INTRAVENOUS

## 2018-06-18 MED ORDER — LIDOCAINE HCL (PF) 4 % IJ SOLN
INTRAMUSCULAR | Status: AC
  Filled 2018-06-18: qty 5

## 2018-06-18 MED ORDER — ARMC OPHTHALMIC DILATING DROPS
1.0000 "application " | OPHTHALMIC | Status: AC
  Administered 2018-06-18 (×2): 1 via OPHTHALMIC

## 2018-06-18 MED ORDER — NA CHONDROIT SULF-NA HYALURON 40-17 MG/ML IO SOLN
INTRAOCULAR | Status: AC
  Filled 2018-06-18: qty 1

## 2018-06-18 MED ORDER — POVIDONE-IODINE 5 % OP SOLN
OPHTHALMIC | Status: DC | PRN
  Administered 2018-06-18: 1 via OPHTHALMIC

## 2018-06-18 MED ORDER — POVIDONE-IODINE 5 % OP SOLN
OPHTHALMIC | Status: AC
  Filled 2018-06-18: qty 30

## 2018-06-18 MED ORDER — TETRACAINE HCL 0.5 % OP SOLN
1.0000 [drp] | OPHTHALMIC | Status: AC
  Administered 2018-06-18 (×2): 1 [drp] via OPHTHALMIC

## 2018-06-18 MED ORDER — MIDAZOLAM HCL 2 MG/2ML IJ SOLN
INTRAMUSCULAR | Status: AC
  Filled 2018-06-18: qty 2

## 2018-06-18 MED ORDER — CARBACHOL 0.01 % IO SOLN
INTRAOCULAR | Status: DC | PRN
  Administered 2018-06-18: .5 mL via INTRAOCULAR

## 2018-06-18 MED ORDER — NA CHONDROIT SULF-NA HYALURON 40-17 MG/ML IO SOLN
INTRAOCULAR | Status: DC | PRN
  Administered 2018-06-18: 1 mL via INTRAOCULAR

## 2018-06-18 MED ORDER — LIDOCAINE HCL (PF) 4 % IJ SOLN
INTRAOCULAR | Status: DC | PRN
  Administered 2018-06-18: 2 mL via OPHTHALMIC

## 2018-06-18 MED ORDER — SODIUM CHLORIDE 0.9 % IV SOLN
INTRAVENOUS | Status: DC
  Administered 2018-06-18: 09:00:00 via INTRAVENOUS

## 2018-06-18 MED ORDER — EPINEPHRINE PF 1 MG/ML IJ SOLN
INTRAOCULAR | Status: DC | PRN
  Administered 2018-06-18: 1 mL via OPHTHALMIC

## 2018-06-18 MED ORDER — MOXIFLOXACIN HCL 0.5 % OP SOLN: 1.0000 [drp] | OPHTHALMIC | Status: DC | PRN

## 2018-06-18 MED ORDER — EPINEPHRINE PF 1 MG/ML IJ SOLN
INTRAMUSCULAR | Status: AC
  Filled 2018-06-18: qty 1

## 2018-06-18 MED ORDER — FENTANYL CITRATE (PF) 100 MCG/2ML IJ SOLN: 25.0000 ug | INTRAMUSCULAR | Status: DC | PRN

## 2018-06-18 MED ORDER — ONDANSETRON HCL 4 MG/2ML IJ SOLN: 4.0000 mg | Freq: Once | INTRAMUSCULAR | Status: DC | PRN

## 2018-06-18 MED ORDER — MOXIFLOXACIN HCL 0.5 % OP SOLN
OPHTHALMIC | Status: DC | PRN
  Administered 2018-06-18: .2 mL via OPHTHALMIC

## 2018-06-18 SURGICAL SUPPLY — 16 items
GLOVE BIO SURGEON STRL SZ8 (GLOVE) ×3 IMPLANT
GLOVE BIOGEL M 6.5 STRL (GLOVE) ×3 IMPLANT
GLOVE SURG LX 8.0 MICRO (GLOVE) ×2
GLOVE SURG LX STRL 8.0 MICRO (GLOVE) ×1 IMPLANT
GOWN STRL REUS W/ TWL LRG LVL3 (GOWN DISPOSABLE) ×2 IMPLANT
GOWN STRL REUS W/TWL LRG LVL3 (GOWN DISPOSABLE) ×4
LABEL CATARACT MEDS ST (LABEL) ×3 IMPLANT
LENS IOL TECNIS ITEC 24.5 (Intraocular Lens) ×2 IMPLANT
PACK CATARACT (MISCELLANEOUS) ×3 IMPLANT
PACK CATARACT BRASINGTON LX (MISCELLANEOUS) ×3 IMPLANT
PACK EYE AFTER SURG (MISCELLANEOUS) ×3 IMPLANT
SOL BSS BAG (MISCELLANEOUS) ×3
SOLUTION BSS BAG (MISCELLANEOUS) ×1 IMPLANT
SYR 5ML LL (SYRINGE) ×3 IMPLANT
WATER STERILE IRR 250ML POUR (IV SOLUTION) ×3 IMPLANT
WIPE NON LINTING 3.25X3.25 (MISCELLANEOUS) ×3 IMPLANT

## 2018-06-18 NOTE — Anesthesia Post-op Follow-up Note (Signed)
Anesthesia QCDR form completed.        

## 2018-06-18 NOTE — Op Note (Signed)
PREOPERATIVE DIAGNOSIS:  Nuclear sclerotic cataract of the left eye.   POSTOPERATIVE DIAGNOSIS:  Nuclear sclerotic cataract of the left eye.   OPERATIVE PROCEDURE: Procedure(s): CATARACT EXTRACTION PHACO AND INTRAOCULAR LENS PLACEMENT ([facility])   SURGEON:  Birder Robson, MD.   ANESTHESIA:  Anesthesiologist: Alvin Critchley, MD CRNA: Eben Burow, CRNA  1.      Managed anesthesia care. 2.     0.67ml of Shugarcaine was instilled following the paracentesis   COMPLICATIONS:  None.   TECHNIQUE:   Stop and chop   DESCRIPTION OF PROCEDURE:  The patient was examined and consented in the preoperative holding area where the aforementioned topical anesthesia was applied to the left eye and then brought back to the Operating Room where the left eye was prepped and draped in the usual sterile ophthalmic fashion and a lid speculum was placed. A paracentesis was created with the side port blade and the anterior chamber was filled with viscoelastic. A near clear corneal incision was performed with the steel keratome. A continuous curvilinear capsulorrhexis was performed with a cystotome followed by the capsulorrhexis forceps. Hydrodissection and hydrodelineation were carried out with BSS on a blunt cannula. The lens was removed in a stop and chop  technique and the remaining cortical material was removed with the irrigation-aspiration handpiece. The capsular bag was inflated with viscoelastic and the Technis ZCB00 lens was placed in the capsular bag without complication. The remaining viscoelastic was removed from the eye with the irrigation-aspiration handpiece. The wounds were hydrated. The anterior chamber was flushed with Miostat and the eye was inflated to physiologic pressure. 0.49ml Vigamox was placed in the anterior chamber. The wounds were found to be water tight. The eye was dressed with Vigamox. The patient was given protective glasses to wear throughout the day and a shield with which to sleep tonight.  The patient was also given drops with which to begin a drop regimen today and will follow-up with me in one day. Implant Name Type Inv. Item Serial No. Manufacturer Lot No. LRB No. Used  LENS IOL DIOP 24.5 - [id number] Intraocular Lens LENS IOL DIOP 24.5 [id number] AMO  Left 1    Procedure(s) with comments: CATARACT EXTRACTION PHACO AND INTRAOCULAR LENS PLACEMENT ([facility]) (Left) - [place]  00:23.8 CDE 3.47 Fluid pack lot # [id number]  Electronically signed: Birder Robson 06/18/2018 10:26 AM

## 2018-06-18 NOTE — Anesthesia Preprocedure Evaluation (Signed)
Anesthesia Evaluation  Patient identified by MRN, date of birth, ID band Patient awake    Reviewed: Allergy & Precautions, H&P , NPO status , Patient's Chart, lab work & pertinent test results  History of Anesthesia Complications Negative for: history of anesthetic complications  Airway Mallampati: II  TM Distance: <3 FB Neck ROM: limited    Dental  (+) Poor Dentition, Missing, Upper Dentures, Lower Dentures   Pulmonary neg pulmonary ROS, neg shortness of breath,    Pulmonary exam normal breath sounds clear to auscultation       Cardiovascular Exercise Tolerance: Good hypertension, Pt. on medications (-) angina(-) DOE Normal cardiovascular exam Rhythm:regular Rate:Normal     Neuro/Psych PSYCHIATRIC DISORDERS Depression negative neurological ROS     GI/Hepatic Neg liver ROS, hiatal hernia, GERD  Controlled,  Endo/Other  negative endocrine ROS  Renal/GU negative Renal ROS  negative genitourinary   Musculoskeletal  (+) Arthritis , Osteoarthritis,    Abdominal   Peds  Hematology negative hematology ROS (+) anemia ,   Anesthesia Other Findings Past Medical History:   Hypertension                                                 GERD (gastroesophageal reflux disease)                         Comment:erosive esophagus   History of hiatal hernia                                     Arthritis                                                    Blood clot associated with vein wall inflammat*                Comment:Left Leg  Past Surgical History:   ABDOMINAL HYSTERECTOMY                                        JOINT REPLACEMENT                               Bilateral 2004, 2010     Comment:Total Knee Replacement   APPENDECTOMY                                                  TONSILLECTOMY                                                 SHOULDER ARTHROSCOPY W/ ROTATOR CUFF REPAIR     Left             BMI    Body Mass  Index   26.64 kg/m 2      Reproductive/Obstetrics negative OB ROS                             Anesthesia Physical  Anesthesia Plan  ASA: III  Anesthesia Plan: MAC   Post-op Pain Management:    Induction: Intravenous  PONV Risk Score and Plan:   Airway Management Planned: Nasal Cannula  Additional Equipment:   Intra-op Plan:   Post-operative Plan:   Informed Consent: I have reviewed the patients History and Physical, chart, labs and discussed the procedure including the risks, benefits and alternatives for the proposed anesthesia with the patient or authorized representative who has indicated his/her understanding and acceptance.   Dental Advisory Given  Plan Discussed with: Anesthesiologist, CRNA and Surgeon  Anesthesia Plan Comments:         Anesthesia Quick Evaluation

## 2018-06-18 NOTE — Discharge Instructions (Addendum)
Eye Surgery Discharge Instructions  Expect mild scratchy sensation or mild soreness. DO NOT RUB YOUR EYE!  The day of surgery:  Minimal physical activity, but bed rest is not required  No reading, computer work, or close hand work  No bending, lifting, or straining.  May watch TV  For 24 hours:  No driving, legal decisions, or alcoholic beverages  Safety precautions  Eat anything you prefer: It is better to start with liquids, then soup then solid foods.  Solar shield eyeglasses should be worn for comfort in the sunlight/patch while sleeping  Resume all regular medications including aspirin or Coumadin if these were discontinued prior to surgery. You may shower, bathe, shave, or wash your hair. Tylenol may be taken for mild discomfort. Follow Dr. Inda Coke eye drop instruction sheet as reviewed.  Call your doctor if you experience significant pain, nausea, or vomiting, fever > 101 or other signs of infection. [phone] or [phone] Specific instructions:  Follow-up Information    Birder Robson, MD Follow up.   Specialty:  Ophthalmology Why:  06/19/18 @ 10:00 am Contact information: [place] [address] [phone]

## 2018-06-18 NOTE — Anesthesia Postprocedure Evaluation (Signed)
Anesthesia Post Note  Patient: Elizabeth Reed  Procedure(s) Performed: CATARACT EXTRACTION PHACO AND INTRAOCULAR LENS PLACEMENT ([place]) (Left Eye)  Patient location during evaluation: Short Stay Anesthesia Type: MAC Level of consciousness: awake and alert, oriented and patient cooperative Pain management: satisfactory to patient Vital Signs Assessment: post-procedure vital signs reviewed and stable Respiratory status: spontaneous breathing, respiratory function stable and nonlabored ventilation Cardiovascular status: blood pressure returned to baseline and stable Postop Assessment: no headache and no apparent nausea or vomiting Anesthetic complications: no     Last Vitals:  Vitals:   06/18/18 0916 06/18/18 1029  BP: (!) 155/83 (!) 166/83  Pulse: 83 74  Resp: 16 16  Temp: (!) 35.9 C 36.5 C  SpO2: 100% 100%    Last Pain:  Vitals:   06/18/18 1029  TempSrc: Oral  PainSc: 0-No pain                 Eben Burow

## 2018-06-18 NOTE — Transfer of Care (Signed)
Immediate Anesthesia Transfer of Care Note  Patient: Elizabeth Reed  Procedure(s) Performed: CATARACT EXTRACTION PHACO AND INTRAOCULAR LENS PLACEMENT ([facility]) (Left Eye)  Patient Location: Short Stay  Anesthesia Type:MAC  Level of Consciousness: awake, alert , oriented and patient cooperative  Airway & Oxygen Therapy: Patient Spontanous Breathing  Post-op Assessment: Report given to RN and Post -op Vital signs reviewed and stable  Post vital signs: Reviewed and stable  Last Vitals:  Vitals Value Taken Time  BP    Temp    Pulse    Resp    SpO2      Last Pain:  Vitals:   06/18/18 0916  TempSrc: Tympanic  PainSc: 0-No pain         Complications: No apparent anesthesia complications

## 2018-06-18 NOTE — H&P (Signed)
All labs reviewed. Abnormal studies sent to patients PCP when indicated.  Previous H&P reviewed, patient examined, there are NO CHANGES.  Rober Skeels Porfilio12/3/20199:53 AM

## 2018-06-19 ENCOUNTER — Encounter: Payer: Self-pay | Admitting: Ophthalmology

## 2018-07-16 DIAGNOSIS — M5136 Other intervertebral disc degeneration, lumbar region: Secondary | ICD-10-CM | POA: Diagnosis not present

## 2018-07-16 DIAGNOSIS — M955 Acquired deformity of pelvis: Secondary | ICD-10-CM | POA: Diagnosis not present

## 2018-07-16 DIAGNOSIS — M9903 Segmental and somatic dysfunction of lumbar region: Secondary | ICD-10-CM | POA: Diagnosis not present

## 2018-07-16 DIAGNOSIS — M9905 Segmental and somatic dysfunction of pelvic region: Secondary | ICD-10-CM | POA: Diagnosis not present

## 2018-07-18 DIAGNOSIS — H2511 Age-related nuclear cataract, right eye: Secondary | ICD-10-CM | POA: Diagnosis not present

## 2018-07-22 ENCOUNTER — Encounter: Payer: Self-pay | Admitting: *Deleted

## 2018-07-22 DIAGNOSIS — K219 Gastro-esophageal reflux disease without esophagitis: Secondary | ICD-10-CM | POA: Diagnosis not present

## 2018-07-22 DIAGNOSIS — I10 Essential (primary) hypertension: Secondary | ICD-10-CM | POA: Diagnosis not present

## 2018-07-22 DIAGNOSIS — E559 Vitamin D deficiency, unspecified: Secondary | ICD-10-CM | POA: Diagnosis not present

## 2018-07-30 ENCOUNTER — Other Ambulatory Visit: Payer: Self-pay

## 2018-07-30 ENCOUNTER — Ambulatory Visit
Admission: RE | Admit: 2018-07-30 | Discharge: 2018-07-30 | Disposition: A | Discharge status: Home / Self Care | Payer: PPO | Attending: Ophthalmology | Admitting: Ophthalmology

## 2018-07-30 ENCOUNTER — Encounter
Admission: RE | Disposition: A | Discharge status: Home / Self Care | Payer: Self-pay | Source: Home / Self Care | Attending: Ophthalmology

## 2018-07-30 ENCOUNTER — Ambulatory Visit: Payer: PPO | Admitting: Certified Registered Nurse Anesthetist

## 2018-07-30 ENCOUNTER — Encounter: Payer: Self-pay | Admitting: *Deleted

## 2018-07-30 DIAGNOSIS — Z96653 Presence of artificial knee joint, bilateral: Secondary | ICD-10-CM | POA: Insufficient documentation

## 2018-07-30 DIAGNOSIS — Z7982 Long term (current) use of aspirin: Secondary | ICD-10-CM | POA: Diagnosis not present

## 2018-07-30 DIAGNOSIS — K219 Gastro-esophageal reflux disease without esophagitis: Secondary | ICD-10-CM | POA: Diagnosis not present

## 2018-07-30 DIAGNOSIS — F329 Major depressive disorder, single episode, unspecified: Secondary | ICD-10-CM | POA: Diagnosis not present

## 2018-07-30 DIAGNOSIS — H2511 Age-related nuclear cataract, right eye: Secondary | ICD-10-CM | POA: Insufficient documentation

## 2018-07-30 DIAGNOSIS — I10 Essential (primary) hypertension: Secondary | ICD-10-CM | POA: Insufficient documentation

## 2018-07-30 DIAGNOSIS — Z79899 Other long term (current) drug therapy: Secondary | ICD-10-CM | POA: Diagnosis not present

## 2018-07-30 HISTORY — PX: CATARACT EXTRACTION W/PHACO: SHX586

## 2018-07-30 SURGERY — PHACOEMULSIFICATION, CATARACT, WITH IOL INSERTION
Anesthesia: Monitor Anesthesia Care | Site: Eye | Laterality: Right

## 2018-07-30 MED ORDER — MOXIFLOXACIN HCL 0.5 % OP SOLN
OPHTHALMIC | Status: DC | PRN
  Administered 2018-07-30: .2 mL via OPHTHALMIC

## 2018-07-30 MED ORDER — NA CHONDROIT SULF-NA HYALURON 40-17 MG/ML IO SOLN
INTRAOCULAR | Status: DC | PRN
  Administered 2018-07-30: 1 mL via INTRAOCULAR

## 2018-07-30 MED ORDER — EPINEPHRINE PF 1 MG/ML IJ SOLN
INTRAOCULAR | Status: DC | PRN
  Administered 2018-07-30: 1 mL via OPHTHALMIC

## 2018-07-30 MED ORDER — MIDAZOLAM HCL 2 MG/2ML IJ SOLN
INTRAMUSCULAR | Status: DC | PRN
  Administered 2018-07-30 (×2): 1 mg via INTRAVENOUS

## 2018-07-30 MED ORDER — ARMC OPHTHALMIC DILATING DROPS
1.0000 "application " | OPHTHALMIC | Status: AC
  Administered 2018-07-30 (×3): 1 via OPHTHALMIC

## 2018-07-30 MED ORDER — CARBACHOL 0.01 % IO SOLN
INTRAOCULAR | Status: DC | PRN
  Administered 2018-07-30: .5 mL via INTRAOCULAR

## 2018-07-30 MED ORDER — SODIUM CHLORIDE 0.9 % IV SOLN
INTRAVENOUS | Status: DC
  Administered 2018-07-30: 09:00:00 via INTRAVENOUS

## 2018-07-30 MED ORDER — LIDOCAINE HCL (PF) 4 % IJ SOLN
INTRAOCULAR | Status: DC | PRN
  Administered 2018-07-30: 2 mL via OPHTHALMIC

## 2018-07-30 MED ORDER — ARMC OPHTHALMIC DILATING DROPS
OPHTHALMIC | Status: AC
  Administered 2018-07-30: 1 via OPHTHALMIC
  Filled 2018-07-30: qty 0.5

## 2018-07-30 MED ORDER — ONDANSETRON HCL 4 MG/2ML IJ SOLN: INTRAMUSCULAR | Status: DC | PRN

## 2018-07-30 MED ORDER — TETRACAINE HCL 0.5 % OP SOLN
1.0000 [drp] | OPHTHALMIC | Status: AC | PRN
  Administered 2018-07-30 (×3): 1 [drp] via OPHTHALMIC

## 2018-07-30 MED ORDER — MIDAZOLAM HCL 2 MG/2ML IJ SOLN
INTRAMUSCULAR | Status: AC
  Filled 2018-07-30: qty 2

## 2018-07-30 MED ORDER — POVIDONE-IODINE 5 % OP SOLN
OPHTHALMIC | Status: DC | PRN
  Administered 2018-07-30: 1 via OPHTHALMIC

## 2018-07-30 MED ORDER — TETRACAINE HCL 0.5 % OP SOLN
OPHTHALMIC | Status: AC
  Administered 2018-07-30: 1 [drp] via OPHTHALMIC
  Filled 2018-07-30: qty 4

## 2018-07-30 MED ORDER — MOXIFLOXACIN HCL 0.5 % OP SOLN
OPHTHALMIC | Status: AC
  Filled 2018-07-30: qty 3

## 2018-07-30 MED ORDER — MOXIFLOXACIN HCL 0.5 % OP SOLN: 1.0000 [drp] | OPHTHALMIC | Status: DC | PRN

## 2018-07-30 SURGICAL SUPPLY — 16 items
GLOVE BIO SURGEON STRL SZ8 (GLOVE) ×3 IMPLANT
GLOVE BIOGEL M 6.5 STRL (GLOVE) ×3 IMPLANT
GLOVE SURG LX 8.0 MICRO (GLOVE) ×2
GLOVE SURG LX STRL 8.0 MICRO (GLOVE) ×1 IMPLANT
GOWN STRL REUS W/ TWL LRG LVL3 (GOWN DISPOSABLE) ×2 IMPLANT
GOWN STRL REUS W/TWL LRG LVL3 (GOWN DISPOSABLE) ×4
LABEL CATARACT MEDS ST (LABEL) ×3 IMPLANT
LENS IOL TECNIS ITEC 25.0 (Intraocular Lens) ×2 IMPLANT
PACK CATARACT (MISCELLANEOUS) ×3 IMPLANT
PACK CATARACT BRASINGTON LX (MISCELLANEOUS) ×3 IMPLANT
PACK EYE AFTER SURG (MISCELLANEOUS) ×3 IMPLANT
SOL BSS BAG (MISCELLANEOUS) ×3
SOLUTION BSS BAG (MISCELLANEOUS) ×1 IMPLANT
SYR 5ML LL (SYRINGE) ×3 IMPLANT
WATER STERILE IRR 250ML POUR (IV SOLUTION) ×3 IMPLANT
WIPE NON LINTING 3.25X3.25 (MISCELLANEOUS) ×3 IMPLANT

## 2018-07-30 NOTE — Transfer of Care (Signed)
Immediate Anesthesia Transfer of Care Note  Patient: Elizabeth Reed  Procedure(s) Performed: CATARACT EXTRACTION PHACO AND INTRAOCULAR LENS PLACEMENT ([facility]) RIGHT (Right Eye)  Patient Location: Short Stay  Anesthesia Type:MAC  Level of Consciousness: awake, alert , oriented and patient cooperative  Airway & Oxygen Therapy: Patient Spontanous Breathing  Post-op Assessment: Report given to RN and Post -op Vital signs reviewed and stable  Post vital signs: Reviewed and stable  Last Vitals:  Vitals Value Taken Time  BP    Temp    Pulse    Resp    SpO2      Last Pain:  Vitals:   07/30/18 0900  TempSrc: Tympanic  PainSc: 0-No pain         Complications: No apparent anesthesia complications

## 2018-07-30 NOTE — Anesthesia Postprocedure Evaluation (Signed)
Anesthesia Post Note  Patient: Carly Sabo Shrum  Procedure(s) Performed: CATARACT EXTRACTION PHACO AND INTRAOCULAR LENS PLACEMENT ([facility]) RIGHT (Right Eye)  Patient location during evaluation: Short Stay Anesthesia Type: MAC Level of consciousness: oriented, awake and alert and patient cooperative Pain management: satisfactory to patient Vital Signs Assessment: post-procedure vital signs reviewed and stable Respiratory status: spontaneous breathing, nonlabored ventilation and respiratory function stable Cardiovascular status: blood pressure returned to baseline and stable Postop Assessment: no headache and no apparent nausea or vomiting Anesthetic complications: no     Last Vitals:  Vitals:   07/30/18 0900 07/30/18 1049  BP: (!) 167/86 (!) 168/77  Pulse: 81 80  Resp: 16 16  Temp: (!) 36.3 C (!) 36.1 C  SpO2: 100% 99%    Last Pain:  Vitals:   07/30/18 1049  TempSrc: Temporal  PainSc: 0-No pain                 Eben Burow

## 2018-07-30 NOTE — Anesthesia Preprocedure Evaluation (Signed)
Anesthesia Evaluation  Patient identified by MRN, date of birth, ID band Patient awake    Reviewed: Allergy & Precautions, NPO status , Patient's Chart, lab work & pertinent test results  History of Anesthesia Complications Negative for: history of anesthetic complications  Airway Mallampati: III  TM Distance: <3 FB Neck ROM: limited    Dental  (+) Poor Dentition, Missing, Upper Dentures, Lower Dentures   Pulmonary neg shortness of breath,    Pulmonary exam normal breath sounds clear to auscultation       Cardiovascular Exercise Tolerance: Good hypertension, Pt. on medications (-) angina(-) DOE Normal cardiovascular exam Rhythm:regular Rate:Normal     Neuro/Psych PSYCHIATRIC DISORDERS Depression negative neurological ROS     GI/Hepatic Neg liver ROS, hiatal hernia, GERD  Controlled,  Endo/Other  negative endocrine ROS  Renal/GU negative Renal ROS  negative genitourinary   Musculoskeletal  (+) Arthritis , Osteoarthritis,    Abdominal   Peds  Hematology  (+) Blood dyscrasia, anemia ,   Anesthesia Other Findings Past Medical History:   Hypertension                                                 GERD (gastroesophageal reflux disease)                         Comment:erosive esophagus   History of hiatal hernia                                     Arthritis                                                    Blood clot associated with vein wall inflammat*                Comment:Left Leg  Past Surgical History:   ABDOMINAL HYSTERECTOMY                                        JOINT REPLACEMENT                               Bilateral 2004, 2010     Comment:Total Knee Replacement   APPENDECTOMY                                                  TONSILLECTOMY                                                 SHOULDER ARTHROSCOPY W/ ROTATOR CUFF REPAIR     Left             BMI    Body Mass Index   26.64 kg/m  2      Reproductive/Obstetrics negative OB ROS                             Anesthesia Physical  Anesthesia Plan  ASA: III  Anesthesia Plan: MAC   Post-op Pain Management:    Induction: Intravenous  PONV Risk Score and Plan:   Airway Management Planned: Natural Airway and Nasal Cannula  Additional Equipment:   Intra-op Plan:   Post-operative Plan:   Informed Consent: I have reviewed the patients History and Physical, chart, labs and discussed the procedure including the risks, benefits and alternatives for the proposed anesthesia with the patient or authorized representative who has indicated his/her understanding and acceptance.     Dental Advisory Given  Plan Discussed with: Anesthesiologist, CRNA and Surgeon  Anesthesia Plan Comments: (Patient consented for risks of anesthesia including but not limited to:  - adverse reactions to medications - damage to teeth, lips or other oral mucosa - sore throat or hoarseness - Damage to heart, brain, lungs or loss of life  Patient voiced understanding.)        Anesthesia Quick Evaluation

## 2018-07-30 NOTE — Anesthesia Post-op Follow-up Note (Signed)
Anesthesia QCDR form completed.        

## 2018-07-30 NOTE — Op Note (Signed)
PREOPERATIVE DIAGNOSIS:  Nuclear sclerotic cataract of the right eye.   POSTOPERATIVE DIAGNOSIS:  nuclear sclerotic cataract right eye   OPERATIVE PROCEDURE: Procedure(s): CATARACT EXTRACTION PHACO AND INTRAOCULAR LENS PLACEMENT ([facility]) RIGHT   SURGEON:  Birder Robson, MD.   ANESTHESIA:  Anesthesiologist: Piscitello, Precious Haws, MD CRNA: Eben Burow, CRNA  1.      Managed anesthesia care. 2.      0.37ml of Shugarcaine was instilled in the eye following the paracentesis.   COMPLICATIONS:  None.   TECHNIQUE:   Stop and chop   DESCRIPTION OF PROCEDURE:  The patient was examined and consented in the preoperative holding area where the aforementioned topical anesthesia was applied to the right eye and then brought back to the Operating Room where the right eye was prepped and draped in the usual sterile ophthalmic fashion and a lid speculum was placed. A paracentesis was created with the side port blade and the anterior chamber was filled with viscoelastic. A near clear corneal incision was performed with the steel keratome. A continuous curvilinear capsulorrhexis was performed with a cystotome followed by the capsulorrhexis forceps. Hydrodissection and hydrodelineation were carried out with BSS on a blunt cannula. The lens was removed in a stop and chop  technique and the remaining cortical material was removed with the irrigation-aspiration handpiece. The capsular bag was inflated with viscoelastic and the Technis ZCB00  lens was placed in the capsular bag without complication. The remaining viscoelastic was removed from the eye with the irrigation-aspiration handpiece. The wounds were hydrated. The anterior chamber was flushed with Miostat and the eye was inflated to physiologic pressure. 0.59ml of Vigamox was placed in the anterior chamber. The wounds were found to be water tight. The eye was dressed with Vigamox. The patient was given protective glasses to wear throughout the day and a shield  with which to sleep tonight. The patient was also given drops with which to begin a drop regimen today and will follow-up with me in one day. Implant Name Type Inv. Item Serial No. Manufacturer Lot No. LRB No. Used  LENS IOL DIOP 25.0 - [id number] Intraocular Lens LENS IOL DIOP 25.0 [id number] AMO  Right 1   Procedure(s) with comments: CATARACT EXTRACTION PHACO AND INTRAOCULAR LENS PLACEMENT ([facility]) RIGHT (Right) - [place]  00:37 CDE 5.13 Fluid pack lot # [id number]  Electronically signed: Birder Robson 07/30/2018 10:46 AM

## 2018-07-30 NOTE — Discharge Instructions (Addendum)
Eye Surgery Discharge Instructions  Expect mild scratchy sensation or mild soreness. DO NOT RUB YOUR EYE!  The day of surgery:  Minimal physical activity, but bed rest is not required  No reading, computer work, or close hand work  No bending, lifting, or straining.  May watch TV  For 24 hours:  No driving, legal decisions, or alcoholic beverages  Safety precautions  Eat anything you prefer: It is better to start with liquids, then soup then solid foods.  Solar shield eyeglasses should be worn for comfort in the sunlight/patch while sleeping  Resume all regular medications including aspirin or Coumadin if these were discontinued prior to surgery. You may shower, bathe, shave, or wash your hair. Tylenol may be taken for mild discomfort. FOLLOW DR. PORFILIO'S EYE DROP INSTRUCTION SHEET AS REVIEWED.  Call your doctor if you experience significant pain, nausea, or vomiting, fever > 101 or other signs of infection. [phone] or [phone] Specific instructions:  Follow-up Information    Birder Robson, MD Follow up.   Specialty:  Ophthalmology Why:  07/31/18 @ 11:00 am Contact information: [place] [id number] [phone]

## 2018-07-30 NOTE — H&P (Signed)
All labs reviewed. Abnormal studies sent to patients PCP when indicated.  Previous H&P reviewed, patient examined, there are NO CHANGES.  Abrial Arrighi Porfilio1/14/202010:21 AM

## 2018-07-31 ENCOUNTER — Encounter: Payer: Self-pay | Admitting: Ophthalmology

## 2018-08-06 DIAGNOSIS — K219 Gastro-esophageal reflux disease without esophagitis: Secondary | ICD-10-CM | POA: Diagnosis not present

## 2018-08-06 DIAGNOSIS — Z1239 Encounter for other screening for malignant neoplasm of breast: Secondary | ICD-10-CM | POA: Diagnosis not present

## 2018-08-06 DIAGNOSIS — F329 Major depressive disorder, single episode, unspecified: Secondary | ICD-10-CM | POA: Diagnosis not present

## 2018-08-06 DIAGNOSIS — I10 Essential (primary) hypertension: Secondary | ICD-10-CM | POA: Diagnosis not present

## 2018-08-06 DIAGNOSIS — Z0000 Encounter for general adult medical examination without abnormal findings: Secondary | ICD-10-CM | POA: Diagnosis not present

## 2018-08-06 DIAGNOSIS — Z0001 Encounter for general adult medical examination with abnormal findings: Secondary | ICD-10-CM | POA: Diagnosis not present

## 2018-08-12 ENCOUNTER — Other Ambulatory Visit: Payer: Self-pay | Admitting: Internal Medicine

## 2018-08-12 DIAGNOSIS — Z1231 Encounter for screening mammogram for malignant neoplasm of breast: Secondary | ICD-10-CM

## 2018-08-13 DIAGNOSIS — M955 Acquired deformity of pelvis: Secondary | ICD-10-CM | POA: Diagnosis not present

## 2018-08-13 DIAGNOSIS — M9903 Segmental and somatic dysfunction of lumbar region: Secondary | ICD-10-CM | POA: Diagnosis not present

## 2018-08-13 DIAGNOSIS — M9905 Segmental and somatic dysfunction of pelvic region: Secondary | ICD-10-CM | POA: Diagnosis not present

## 2018-08-13 DIAGNOSIS — M5136 Other intervertebral disc degeneration, lumbar region: Secondary | ICD-10-CM | POA: Diagnosis not present

## 2018-09-10 DIAGNOSIS — M955 Acquired deformity of pelvis: Secondary | ICD-10-CM | POA: Diagnosis not present

## 2018-09-10 DIAGNOSIS — M5136 Other intervertebral disc degeneration, lumbar region: Secondary | ICD-10-CM | POA: Diagnosis not present

## 2018-09-10 DIAGNOSIS — M9905 Segmental and somatic dysfunction of pelvic region: Secondary | ICD-10-CM | POA: Diagnosis not present

## 2018-09-10 DIAGNOSIS — M9903 Segmental and somatic dysfunction of lumbar region: Secondary | ICD-10-CM | POA: Diagnosis not present

## 2018-11-11 DIAGNOSIS — I10 Essential (primary) hypertension: Secondary | ICD-10-CM | POA: Diagnosis not present

## 2018-11-25 DIAGNOSIS — I10 Essential (primary) hypertension: Secondary | ICD-10-CM | POA: Diagnosis not present

## 2018-11-25 DIAGNOSIS — M1990 Unspecified osteoarthritis, unspecified site: Secondary | ICD-10-CM | POA: Diagnosis not present

## 2018-11-25 DIAGNOSIS — K219 Gastro-esophageal reflux disease without esophagitis: Secondary | ICD-10-CM | POA: Diagnosis not present

## 2018-11-25 DIAGNOSIS — F5101 Primary insomnia: Secondary | ICD-10-CM | POA: Diagnosis not present

## 2018-11-25 DIAGNOSIS — F329 Major depressive disorder, single episode, unspecified: Secondary | ICD-10-CM | POA: Diagnosis not present

## 2019-01-16 DIAGNOSIS — M1990 Unspecified osteoarthritis, unspecified site: Secondary | ICD-10-CM | POA: Diagnosis not present

## 2019-01-16 DIAGNOSIS — K219 Gastro-esophageal reflux disease without esophagitis: Secondary | ICD-10-CM | POA: Diagnosis not present

## 2019-01-16 DIAGNOSIS — I10 Essential (primary) hypertension: Secondary | ICD-10-CM | POA: Diagnosis not present

## 2019-01-16 DIAGNOSIS — F329 Major depressive disorder, single episode, unspecified: Secondary | ICD-10-CM | POA: Diagnosis not present

## 2019-01-28 DIAGNOSIS — M955 Acquired deformity of pelvis: Secondary | ICD-10-CM | POA: Diagnosis not present

## 2019-01-28 DIAGNOSIS — M9905 Segmental and somatic dysfunction of pelvic region: Secondary | ICD-10-CM | POA: Diagnosis not present

## 2019-01-28 DIAGNOSIS — M9903 Segmental and somatic dysfunction of lumbar region: Secondary | ICD-10-CM | POA: Diagnosis not present

## 2019-01-28 DIAGNOSIS — M5136 Other intervertebral disc degeneration, lumbar region: Secondary | ICD-10-CM | POA: Diagnosis not present

## 2019-02-25 DIAGNOSIS — M5136 Other intervertebral disc degeneration, lumbar region: Secondary | ICD-10-CM | POA: Diagnosis not present

## 2019-02-25 DIAGNOSIS — M9905 Segmental and somatic dysfunction of pelvic region: Secondary | ICD-10-CM | POA: Diagnosis not present

## 2019-02-25 DIAGNOSIS — M955 Acquired deformity of pelvis: Secondary | ICD-10-CM | POA: Diagnosis not present

## 2019-02-25 DIAGNOSIS — M9903 Segmental and somatic dysfunction of lumbar region: Secondary | ICD-10-CM | POA: Diagnosis not present

## 2019-03-25 DIAGNOSIS — M9905 Segmental and somatic dysfunction of pelvic region: Secondary | ICD-10-CM | POA: Diagnosis not present

## 2019-03-25 DIAGNOSIS — M9903 Segmental and somatic dysfunction of lumbar region: Secondary | ICD-10-CM | POA: Diagnosis not present

## 2019-03-25 DIAGNOSIS — M5136 Other intervertebral disc degeneration, lumbar region: Secondary | ICD-10-CM | POA: Diagnosis not present

## 2019-03-25 DIAGNOSIS — M955 Acquired deformity of pelvis: Secondary | ICD-10-CM | POA: Diagnosis not present

## 2019-03-31 DIAGNOSIS — H43813 Vitreous degeneration, bilateral: Secondary | ICD-10-CM | POA: Diagnosis not present

## 2019-04-22 DIAGNOSIS — M955 Acquired deformity of pelvis: Secondary | ICD-10-CM | POA: Diagnosis not present

## 2019-04-22 DIAGNOSIS — M9905 Segmental and somatic dysfunction of pelvic region: Secondary | ICD-10-CM | POA: Diagnosis not present

## 2019-04-22 DIAGNOSIS — M5136 Other intervertebral disc degeneration, lumbar region: Secondary | ICD-10-CM | POA: Diagnosis not present

## 2019-04-22 DIAGNOSIS — M9903 Segmental and somatic dysfunction of lumbar region: Secondary | ICD-10-CM | POA: Diagnosis not present

## 2019-05-20 DIAGNOSIS — M5136 Other intervertebral disc degeneration, lumbar region: Secondary | ICD-10-CM | POA: Diagnosis not present

## 2019-05-20 DIAGNOSIS — M955 Acquired deformity of pelvis: Secondary | ICD-10-CM | POA: Diagnosis not present

## 2019-05-20 DIAGNOSIS — M9905 Segmental and somatic dysfunction of pelvic region: Secondary | ICD-10-CM | POA: Diagnosis not present

## 2019-05-20 DIAGNOSIS — M9903 Segmental and somatic dysfunction of lumbar region: Secondary | ICD-10-CM | POA: Diagnosis not present

## 2019-08-04 DIAGNOSIS — Z0001 Encounter for general adult medical examination with abnormal findings: Secondary | ICD-10-CM | POA: Diagnosis not present

## 2019-08-04 DIAGNOSIS — I10 Essential (primary) hypertension: Secondary | ICD-10-CM | POA: Diagnosis not present

## 2019-08-11 ENCOUNTER — Other Ambulatory Visit: Payer: Self-pay | Admitting: Infectious Diseases

## 2019-08-11 DIAGNOSIS — Z1231 Encounter for screening mammogram for malignant neoplasm of breast: Secondary | ICD-10-CM | POA: Diagnosis not present

## 2019-08-11 DIAGNOSIS — Z780 Asymptomatic menopausal state: Secondary | ICD-10-CM | POA: Diagnosis not present

## 2019-08-11 DIAGNOSIS — Z66 Do not resuscitate: Secondary | ICD-10-CM | POA: Diagnosis not present

## 2019-08-11 DIAGNOSIS — R8290 Unspecified abnormal findings in urine: Secondary | ICD-10-CM | POA: Diagnosis not present

## 2019-08-11 DIAGNOSIS — K219 Gastro-esophageal reflux disease without esophagitis: Secondary | ICD-10-CM | POA: Diagnosis not present

## 2019-08-11 DIAGNOSIS — Z0000 Encounter for general adult medical examination without abnormal findings: Secondary | ICD-10-CM | POA: Diagnosis not present

## 2019-08-11 DIAGNOSIS — I10 Essential (primary) hypertension: Secondary | ICD-10-CM | POA: Diagnosis not present

## 2019-08-11 DIAGNOSIS — N179 Acute kidney failure, unspecified: Secondary | ICD-10-CM | POA: Diagnosis not present

## 2019-08-12 DIAGNOSIS — M955 Acquired deformity of pelvis: Secondary | ICD-10-CM | POA: Diagnosis not present

## 2019-08-12 DIAGNOSIS — M5136 Other intervertebral disc degeneration, lumbar region: Secondary | ICD-10-CM | POA: Diagnosis not present

## 2019-08-12 DIAGNOSIS — M9905 Segmental and somatic dysfunction of pelvic region: Secondary | ICD-10-CM | POA: Diagnosis not present

## 2019-08-12 DIAGNOSIS — M9903 Segmental and somatic dysfunction of lumbar region: Secondary | ICD-10-CM | POA: Diagnosis not present

## 2019-08-13 DIAGNOSIS — R8290 Unspecified abnormal findings in urine: Secondary | ICD-10-CM | POA: Diagnosis not present

## 2019-08-14 DIAGNOSIS — M8588 Other specified disorders of bone density and structure, other site: Secondary | ICD-10-CM | POA: Diagnosis not present

## 2019-08-19 DIAGNOSIS — I10 Essential (primary) hypertension: Secondary | ICD-10-CM | POA: Diagnosis not present

## 2019-09-09 DIAGNOSIS — M9903 Segmental and somatic dysfunction of lumbar region: Secondary | ICD-10-CM | POA: Diagnosis not present

## 2019-09-09 DIAGNOSIS — M955 Acquired deformity of pelvis: Secondary | ICD-10-CM | POA: Diagnosis not present

## 2019-09-09 DIAGNOSIS — M9905 Segmental and somatic dysfunction of pelvic region: Secondary | ICD-10-CM | POA: Diagnosis not present

## 2019-09-09 DIAGNOSIS — M5136 Other intervertebral disc degeneration, lumbar region: Secondary | ICD-10-CM | POA: Diagnosis not present

## 2019-10-30 ENCOUNTER — Ambulatory Visit
Admission: RE | Admit: 2019-10-30 | Discharge: 2019-10-30 | Disposition: A | Discharge status: Home / Self Care | Payer: PPO | Source: Ambulatory Visit | Attending: Infectious Diseases | Admitting: Infectious Diseases

## 2019-10-30 DIAGNOSIS — Z1231 Encounter for screening mammogram for malignant neoplasm of breast: Secondary | ICD-10-CM | POA: Diagnosis not present

## 2019-12-09 DIAGNOSIS — E559 Vitamin D deficiency, unspecified: Secondary | ICD-10-CM | POA: Diagnosis not present

## 2019-12-09 DIAGNOSIS — I10 Essential (primary) hypertension: Secondary | ICD-10-CM | POA: Diagnosis not present

## 2019-12-09 DIAGNOSIS — N179 Acute kidney failure, unspecified: Secondary | ICD-10-CM | POA: Diagnosis not present

## 2019-12-09 DIAGNOSIS — R8290 Unspecified abnormal findings in urine: Secondary | ICD-10-CM | POA: Diagnosis not present

## 2019-12-11 DIAGNOSIS — E871 Hypo-osmolality and hyponatremia: Secondary | ICD-10-CM | POA: Diagnosis not present

## 2019-12-11 DIAGNOSIS — I10 Essential (primary) hypertension: Secondary | ICD-10-CM | POA: Diagnosis not present

## 2019-12-18 DIAGNOSIS — E871 Hypo-osmolality and hyponatremia: Secondary | ICD-10-CM | POA: Diagnosis not present

## 2019-12-24 DIAGNOSIS — I10 Essential (primary) hypertension: Secondary | ICD-10-CM | POA: Diagnosis not present

## 2019-12-24 DIAGNOSIS — E875 Hyperkalemia: Secondary | ICD-10-CM | POA: Diagnosis not present

## 2019-12-24 DIAGNOSIS — E871 Hypo-osmolality and hyponatremia: Secondary | ICD-10-CM | POA: Diagnosis not present

## 2020-01-08 DIAGNOSIS — E871 Hypo-osmolality and hyponatremia: Secondary | ICD-10-CM | POA: Diagnosis not present

## 2020-01-08 DIAGNOSIS — E875 Hyperkalemia: Secondary | ICD-10-CM | POA: Diagnosis not present

## 2020-01-08 DIAGNOSIS — I10 Essential (primary) hypertension: Secondary | ICD-10-CM | POA: Diagnosis not present

## 2020-02-05 DIAGNOSIS — I10 Essential (primary) hypertension: Secondary | ICD-10-CM | POA: Diagnosis not present

## 2020-02-05 DIAGNOSIS — E875 Hyperkalemia: Secondary | ICD-10-CM | POA: Diagnosis not present

## 2020-03-31 DIAGNOSIS — M3501 Sicca syndrome with keratoconjunctivitis: Secondary | ICD-10-CM | POA: Diagnosis not present

## 2020-06-01 ENCOUNTER — Ambulatory Visit: Payer: PPO

## 2020-08-04 DIAGNOSIS — E875 Hyperkalemia: Secondary | ICD-10-CM | POA: Diagnosis not present

## 2020-08-04 DIAGNOSIS — I10 Essential (primary) hypertension: Secondary | ICD-10-CM | POA: Diagnosis not present

## 2020-08-11 DIAGNOSIS — E871 Hypo-osmolality and hyponatremia: Secondary | ICD-10-CM | POA: Diagnosis not present

## 2020-08-11 DIAGNOSIS — K219 Gastro-esophageal reflux disease without esophagitis: Secondary | ICD-10-CM | POA: Diagnosis not present

## 2020-08-11 DIAGNOSIS — N1831 Chronic kidney disease, stage 3a: Secondary | ICD-10-CM | POA: Diagnosis not present

## 2020-08-11 DIAGNOSIS — I10 Essential (primary) hypertension: Secondary | ICD-10-CM | POA: Diagnosis not present

## 2020-08-11 DIAGNOSIS — E559 Vitamin D deficiency, unspecified: Secondary | ICD-10-CM | POA: Diagnosis not present

## 2020-08-11 DIAGNOSIS — Z0000 Encounter for general adult medical examination without abnormal findings: Secondary | ICD-10-CM | POA: Diagnosis not present

## 2020-10-13 DIAGNOSIS — K529 Noninfective gastroenteritis and colitis, unspecified: Secondary | ICD-10-CM | POA: Diagnosis not present

## 2020-10-13 DIAGNOSIS — I10 Essential (primary) hypertension: Secondary | ICD-10-CM | POA: Diagnosis not present

## 2020-10-13 DIAGNOSIS — R197 Diarrhea, unspecified: Secondary | ICD-10-CM | POA: Diagnosis not present

## 2020-11-01 DIAGNOSIS — I10 Essential (primary) hypertension: Secondary | ICD-10-CM | POA: Diagnosis not present

## 2020-11-01 DIAGNOSIS — R5383 Other fatigue: Secondary | ICD-10-CM | POA: Diagnosis not present

## 2020-11-01 DIAGNOSIS — N1831 Chronic kidney disease, stage 3a: Secondary | ICD-10-CM | POA: Diagnosis not present

## 2020-11-01 DIAGNOSIS — R600 Localized edema: Secondary | ICD-10-CM | POA: Diagnosis not present

## 2020-11-04 DIAGNOSIS — R600 Localized edema: Secondary | ICD-10-CM | POA: Diagnosis not present

## 2020-11-25 DIAGNOSIS — R609 Edema, unspecified: Secondary | ICD-10-CM | POA: Diagnosis not present

## 2020-11-25 DIAGNOSIS — I10 Essential (primary) hypertension: Secondary | ICD-10-CM | POA: Diagnosis not present

## 2020-11-25 DIAGNOSIS — Z86718 Personal history of other venous thrombosis and embolism: Secondary | ICD-10-CM | POA: Diagnosis not present

## 2021-02-03 DIAGNOSIS — E871 Hypo-osmolality and hyponatremia: Secondary | ICD-10-CM | POA: Diagnosis not present

## 2021-02-03 DIAGNOSIS — I10 Essential (primary) hypertension: Secondary | ICD-10-CM | POA: Diagnosis not present

## 2021-02-10 DIAGNOSIS — N1831 Chronic kidney disease, stage 3a: Secondary | ICD-10-CM | POA: Diagnosis not present

## 2021-02-10 DIAGNOSIS — E875 Hyperkalemia: Secondary | ICD-10-CM | POA: Diagnosis not present

## 2021-02-10 DIAGNOSIS — R600 Localized edema: Secondary | ICD-10-CM | POA: Diagnosis not present

## 2021-02-10 DIAGNOSIS — I10 Essential (primary) hypertension: Secondary | ICD-10-CM | POA: Diagnosis not present

## 2021-02-10 DIAGNOSIS — I272 Pulmonary hypertension, unspecified: Secondary | ICD-10-CM | POA: Diagnosis not present

## 2021-02-10 DIAGNOSIS — E559 Vitamin D deficiency, unspecified: Secondary | ICD-10-CM | POA: Diagnosis not present

## 2021-02-14 DIAGNOSIS — E875 Hyperkalemia: Secondary | ICD-10-CM | POA: Diagnosis not present

## 2021-02-14 DIAGNOSIS — N1831 Chronic kidney disease, stage 3a: Secondary | ICD-10-CM | POA: Diagnosis not present

## 2021-02-21 DIAGNOSIS — N1831 Chronic kidney disease, stage 3a: Secondary | ICD-10-CM | POA: Diagnosis not present

## 2021-02-21 DIAGNOSIS — I10 Essential (primary) hypertension: Secondary | ICD-10-CM | POA: Diagnosis not present

## 2021-02-21 DIAGNOSIS — I272 Pulmonary hypertension, unspecified: Secondary | ICD-10-CM | POA: Diagnosis not present

## 2021-02-21 DIAGNOSIS — E875 Hyperkalemia: Secondary | ICD-10-CM | POA: Diagnosis not present

## 2021-02-24 DIAGNOSIS — E875 Hyperkalemia: Secondary | ICD-10-CM | POA: Diagnosis not present

## 2021-03-01 DIAGNOSIS — I10 Essential (primary) hypertension: Secondary | ICD-10-CM | POA: Diagnosis not present

## 2021-03-01 DIAGNOSIS — N1831 Chronic kidney disease, stage 3a: Secondary | ICD-10-CM | POA: Diagnosis not present

## 2021-03-01 DIAGNOSIS — E875 Hyperkalemia: Secondary | ICD-10-CM | POA: Diagnosis not present

## 2021-03-22 DIAGNOSIS — N184 Chronic kidney disease, stage 4 (severe): Secondary | ICD-10-CM | POA: Diagnosis not present

## 2021-03-22 DIAGNOSIS — R600 Localized edema: Secondary | ICD-10-CM | POA: Diagnosis not present

## 2021-03-22 DIAGNOSIS — N1831 Chronic kidney disease, stage 3a: Secondary | ICD-10-CM | POA: Diagnosis not present

## 2021-03-22 DIAGNOSIS — R5383 Other fatigue: Secondary | ICD-10-CM | POA: Diagnosis not present

## 2021-03-22 DIAGNOSIS — E875 Hyperkalemia: Secondary | ICD-10-CM | POA: Diagnosis not present

## 2021-03-22 DIAGNOSIS — I10 Essential (primary) hypertension: Secondary | ICD-10-CM | POA: Diagnosis not present

## 2021-03-30 DIAGNOSIS — M3501 Sicca syndrome with keratoconjunctivitis: Secondary | ICD-10-CM | POA: Diagnosis not present

## 2021-04-13 ENCOUNTER — Other Ambulatory Visit: Payer: Self-pay | Admitting: Nephrology

## 2021-04-13 DIAGNOSIS — R82998 Other abnormal findings in urine: Secondary | ICD-10-CM | POA: Diagnosis not present

## 2021-04-13 DIAGNOSIS — I10 Essential (primary) hypertension: Secondary | ICD-10-CM | POA: Diagnosis not present

## 2021-04-13 DIAGNOSIS — N1831 Chronic kidney disease, stage 3a: Secondary | ICD-10-CM | POA: Diagnosis not present

## 2021-04-13 DIAGNOSIS — E785 Hyperlipidemia, unspecified: Secondary | ICD-10-CM | POA: Diagnosis not present

## 2021-04-13 DIAGNOSIS — E871 Hypo-osmolality and hyponatremia: Secondary | ICD-10-CM | POA: Diagnosis not present

## 2021-04-13 DIAGNOSIS — E875 Hyperkalemia: Secondary | ICD-10-CM | POA: Diagnosis not present

## 2021-04-21 ENCOUNTER — Other Ambulatory Visit: Payer: Self-pay

## 2021-04-21 ENCOUNTER — Ambulatory Visit
Admission: RE | Admit: 2021-04-21 | Discharge: 2021-04-21 | Disposition: A | Discharge status: Home / Self Care | Payer: PPO | Source: Ambulatory Visit | Attending: Nephrology | Admitting: Nephrology

## 2021-04-21 DIAGNOSIS — E785 Hyperlipidemia, unspecified: Secondary | ICD-10-CM | POA: Diagnosis not present

## 2021-04-21 DIAGNOSIS — R82998 Other abnormal findings in urine: Secondary | ICD-10-CM | POA: Insufficient documentation

## 2021-04-21 DIAGNOSIS — N1831 Chronic kidney disease, stage 3a: Secondary | ICD-10-CM | POA: Insufficient documentation

## 2021-05-19 DIAGNOSIS — N1831 Chronic kidney disease, stage 3a: Secondary | ICD-10-CM | POA: Diagnosis not present

## 2021-05-19 DIAGNOSIS — I10 Essential (primary) hypertension: Secondary | ICD-10-CM | POA: Diagnosis not present

## 2021-05-19 DIAGNOSIS — E875 Hyperkalemia: Secondary | ICD-10-CM | POA: Diagnosis not present

## 2021-05-19 DIAGNOSIS — N261 Atrophy of kidney (terminal): Secondary | ICD-10-CM | POA: Diagnosis not present

## 2021-05-19 DIAGNOSIS — E871 Hypo-osmolality and hyponatremia: Secondary | ICD-10-CM | POA: Diagnosis not present

## 2021-06-07 DIAGNOSIS — I272 Pulmonary hypertension, unspecified: Secondary | ICD-10-CM | POA: Diagnosis not present

## 2021-06-07 DIAGNOSIS — R600 Localized edema: Secondary | ICD-10-CM | POA: Diagnosis not present

## 2021-06-07 DIAGNOSIS — N1831 Chronic kidney disease, stage 3a: Secondary | ICD-10-CM | POA: Diagnosis not present

## 2021-06-07 DIAGNOSIS — E875 Hyperkalemia: Secondary | ICD-10-CM | POA: Diagnosis not present

## 2021-06-16 DIAGNOSIS — N1831 Chronic kidney disease, stage 3a: Secondary | ICD-10-CM | POA: Diagnosis not present

## 2021-06-16 DIAGNOSIS — I10 Essential (primary) hypertension: Secondary | ICD-10-CM | POA: Diagnosis not present

## 2021-06-16 DIAGNOSIS — E875 Hyperkalemia: Secondary | ICD-10-CM | POA: Diagnosis not present

## 2021-09-15 DIAGNOSIS — Z1231 Encounter for screening mammogram for malignant neoplasm of breast: Secondary | ICD-10-CM | POA: Diagnosis not present

## 2021-09-15 DIAGNOSIS — E559 Vitamin D deficiency, unspecified: Secondary | ICD-10-CM | POA: Diagnosis not present

## 2021-09-15 DIAGNOSIS — I272 Pulmonary hypertension, unspecified: Secondary | ICD-10-CM | POA: Diagnosis not present

## 2021-09-15 DIAGNOSIS — I10 Essential (primary) hypertension: Secondary | ICD-10-CM | POA: Diagnosis not present

## 2021-09-15 DIAGNOSIS — N1831 Chronic kidney disease, stage 3a: Secondary | ICD-10-CM | POA: Diagnosis not present

## 2021-09-15 DIAGNOSIS — Z0000 Encounter for general adult medical examination without abnormal findings: Secondary | ICD-10-CM | POA: Diagnosis not present

## 2021-09-15 DIAGNOSIS — N184 Chronic kidney disease, stage 4 (severe): Secondary | ICD-10-CM | POA: Diagnosis not present

## 2021-09-16 ENCOUNTER — Other Ambulatory Visit: Payer: Self-pay | Admitting: Infectious Diseases

## 2021-09-16 DIAGNOSIS — Z1231 Encounter for screening mammogram for malignant neoplasm of breast: Secondary | ICD-10-CM

## 2021-10-17 ENCOUNTER — Ambulatory Visit
Admission: RE | Admit: 2021-10-17 | Discharge: 2021-10-17 | Disposition: A | Discharge status: Home / Self Care | Payer: PPO | Source: Ambulatory Visit | Attending: Infectious Diseases | Admitting: Infectious Diseases

## 2021-10-17 DIAGNOSIS — Z1231 Encounter for screening mammogram for malignant neoplasm of breast: Secondary | ICD-10-CM | POA: Insufficient documentation

## 2022-03-16 DIAGNOSIS — N1831 Chronic kidney disease, stage 3a: Secondary | ICD-10-CM | POA: Diagnosis not present

## 2022-03-16 DIAGNOSIS — I10 Essential (primary) hypertension: Secondary | ICD-10-CM | POA: Diagnosis not present

## 2022-03-23 DIAGNOSIS — I10 Essential (primary) hypertension: Secondary | ICD-10-CM | POA: Diagnosis not present

## 2022-03-23 DIAGNOSIS — K219 Gastro-esophageal reflux disease without esophagitis: Secondary | ICD-10-CM | POA: Diagnosis not present

## 2022-03-23 DIAGNOSIS — E7800 Pure hypercholesterolemia, unspecified: Secondary | ICD-10-CM | POA: Diagnosis not present

## 2022-03-23 DIAGNOSIS — N1831 Chronic kidney disease, stage 3a: Secondary | ICD-10-CM | POA: Diagnosis not present

## 2022-04-06 DIAGNOSIS — M3501 Sicca syndrome with keratoconjunctivitis: Secondary | ICD-10-CM | POA: Diagnosis not present

## 2022-09-14 DIAGNOSIS — I10 Essential (primary) hypertension: Secondary | ICD-10-CM | POA: Diagnosis not present

## 2022-09-14 DIAGNOSIS — K219 Gastro-esophageal reflux disease without esophagitis: Secondary | ICD-10-CM | POA: Diagnosis not present

## 2022-09-14 DIAGNOSIS — N1831 Chronic kidney disease, stage 3a: Secondary | ICD-10-CM | POA: Diagnosis not present

## 2022-09-14 DIAGNOSIS — E7800 Pure hypercholesterolemia, unspecified: Secondary | ICD-10-CM | POA: Diagnosis not present

## 2022-09-21 DIAGNOSIS — E559 Vitamin D deficiency, unspecified: Secondary | ICD-10-CM | POA: Diagnosis not present

## 2022-09-21 DIAGNOSIS — I272 Pulmonary hypertension, unspecified: Secondary | ICD-10-CM | POA: Diagnosis not present

## 2022-09-21 DIAGNOSIS — F5101 Primary insomnia: Secondary | ICD-10-CM | POA: Diagnosis not present

## 2022-09-21 DIAGNOSIS — I10 Essential (primary) hypertension: Secondary | ICD-10-CM | POA: Diagnosis not present

## 2022-09-21 DIAGNOSIS — E7800 Pure hypercholesterolemia, unspecified: Secondary | ICD-10-CM | POA: Diagnosis not present

## 2022-09-21 DIAGNOSIS — N184 Chronic kidney disease, stage 4 (severe): Secondary | ICD-10-CM | POA: Diagnosis not present

## 2022-09-21 DIAGNOSIS — Z0000 Encounter for general adult medical examination without abnormal findings: Secondary | ICD-10-CM | POA: Diagnosis not present

## 2022-09-21 DIAGNOSIS — K219 Gastro-esophageal reflux disease without esophagitis: Secondary | ICD-10-CM | POA: Diagnosis not present

## 2022-09-21 DIAGNOSIS — N1831 Chronic kidney disease, stage 3a: Secondary | ICD-10-CM | POA: Diagnosis not present

## 2023-03-22 DIAGNOSIS — K219 Gastro-esophageal reflux disease without esophagitis: Secondary | ICD-10-CM | POA: Diagnosis not present

## 2023-03-22 DIAGNOSIS — N1831 Chronic kidney disease, stage 3a: Secondary | ICD-10-CM | POA: Diagnosis not present

## 2023-03-22 DIAGNOSIS — E7800 Pure hypercholesterolemia, unspecified: Secondary | ICD-10-CM | POA: Diagnosis not present

## 2023-03-22 DIAGNOSIS — I272 Pulmonary hypertension, unspecified: Secondary | ICD-10-CM | POA: Diagnosis not present

## 2023-03-29 DIAGNOSIS — K219 Gastro-esophageal reflux disease without esophagitis: Secondary | ICD-10-CM | POA: Diagnosis not present

## 2023-03-29 DIAGNOSIS — N1831 Chronic kidney disease, stage 3a: Secondary | ICD-10-CM | POA: Diagnosis not present

## 2023-03-29 DIAGNOSIS — I10 Essential (primary) hypertension: Secondary | ICD-10-CM | POA: Diagnosis not present

## 2023-03-29 DIAGNOSIS — I272 Pulmonary hypertension, unspecified: Secondary | ICD-10-CM | POA: Diagnosis not present

## 2023-03-29 DIAGNOSIS — E7800 Pure hypercholesterolemia, unspecified: Secondary | ICD-10-CM | POA: Diagnosis not present

## 2023-04-19 DIAGNOSIS — H35372 Puckering of macula, left eye: Secondary | ICD-10-CM | POA: Diagnosis not present

## 2023-04-19 DIAGNOSIS — Z961 Presence of intraocular lens: Secondary | ICD-10-CM | POA: Diagnosis not present

## 2023-04-19 DIAGNOSIS — G43109 Migraine with aura, not intractable, without status migrainosus: Secondary | ICD-10-CM | POA: Diagnosis not present

## 2023-04-19 DIAGNOSIS — H43813 Vitreous degeneration, bilateral: Secondary | ICD-10-CM | POA: Diagnosis not present

## 2023-05-22 DIAGNOSIS — I872 Venous insufficiency (chronic) (peripheral): Secondary | ICD-10-CM | POA: Diagnosis not present

## 2023-05-22 DIAGNOSIS — I10 Essential (primary) hypertension: Secondary | ICD-10-CM | POA: Diagnosis not present

## 2023-05-22 DIAGNOSIS — Z9889 Other specified postprocedural states: Secondary | ICD-10-CM | POA: Diagnosis not present

## 2023-05-22 DIAGNOSIS — L03116 Cellulitis of left lower limb: Secondary | ICD-10-CM | POA: Diagnosis not present

## 2023-05-23 DIAGNOSIS — L03116 Cellulitis of left lower limb: Secondary | ICD-10-CM | POA: Diagnosis not present

## 2023-05-23 DIAGNOSIS — I872 Venous insufficiency (chronic) (peripheral): Secondary | ICD-10-CM | POA: Diagnosis not present

## 2023-05-23 DIAGNOSIS — I10 Essential (primary) hypertension: Secondary | ICD-10-CM | POA: Diagnosis not present

## 2023-05-23 DIAGNOSIS — I272 Pulmonary hypertension, unspecified: Secondary | ICD-10-CM | POA: Diagnosis not present

## 2023-05-23 DIAGNOSIS — Z9889 Other specified postprocedural states: Secondary | ICD-10-CM | POA: Diagnosis not present

## 2023-05-28 ENCOUNTER — Ambulatory Visit
Admission: RE | Admit: 2023-05-28 | Discharge: 2023-05-28 | Disposition: A | Discharge status: Home / Self Care | Payer: PPO | Source: Ambulatory Visit | Attending: Infectious Diseases | Admitting: Infectious Diseases

## 2023-05-28 ENCOUNTER — Other Ambulatory Visit: Payer: Self-pay | Admitting: Infectious Diseases

## 2023-05-28 DIAGNOSIS — I872 Venous insufficiency (chronic) (peripheral): Secondary | ICD-10-CM | POA: Diagnosis not present

## 2023-05-28 DIAGNOSIS — M79662 Pain in left lower leg: Secondary | ICD-10-CM | POA: Diagnosis not present

## 2023-05-28 DIAGNOSIS — L03116 Cellulitis of left lower limb: Secondary | ICD-10-CM | POA: Diagnosis not present

## 2023-05-28 DIAGNOSIS — M79605 Pain in left leg: Secondary | ICD-10-CM | POA: Diagnosis not present

## 2023-06-01 DIAGNOSIS — L03116 Cellulitis of left lower limb: Secondary | ICD-10-CM | POA: Diagnosis not present

## 2023-06-06 DIAGNOSIS — I872 Venous insufficiency (chronic) (peripheral): Secondary | ICD-10-CM | POA: Diagnosis not present

## 2023-06-06 DIAGNOSIS — L03116 Cellulitis of left lower limb: Secondary | ICD-10-CM | POA: Diagnosis not present

## 2023-06-06 DIAGNOSIS — M79605 Pain in left leg: Secondary | ICD-10-CM | POA: Diagnosis not present

## 2023-06-13 DIAGNOSIS — L03116 Cellulitis of left lower limb: Secondary | ICD-10-CM | POA: Diagnosis not present

## 2023-06-13 DIAGNOSIS — M79605 Pain in left leg: Secondary | ICD-10-CM | POA: Diagnosis not present

## 2023-06-13 DIAGNOSIS — Z9889 Other specified postprocedural states: Secondary | ICD-10-CM | POA: Diagnosis not present

## 2023-06-13 DIAGNOSIS — I872 Venous insufficiency (chronic) (peripheral): Secondary | ICD-10-CM | POA: Diagnosis not present

## 2023-06-13 DIAGNOSIS — I10 Essential (primary) hypertension: Secondary | ICD-10-CM | POA: Diagnosis not present

## 2023-06-18 ENCOUNTER — Encounter (INDEPENDENT_AMBULATORY_CARE_PROVIDER_SITE_OTHER): Payer: Self-pay | Admitting: Vascular Surgery

## 2023-06-18 ENCOUNTER — Ambulatory Visit (INDEPENDENT_AMBULATORY_CARE_PROVIDER_SITE_OTHER): Payer: PPO | Admitting: Vascular Surgery

## 2023-06-18 VITALS — BP 179/86 | HR 64 | Resp 16 | Wt 200.4 lb

## 2023-06-18 DIAGNOSIS — I10 Essential (primary) hypertension: Secondary | ICD-10-CM

## 2023-06-18 DIAGNOSIS — I872 Venous insufficiency (chronic) (peripheral): Secondary | ICD-10-CM

## 2023-06-18 DIAGNOSIS — I890 Lymphedema, not elsewhere classified: Secondary | ICD-10-CM

## 2023-06-18 DIAGNOSIS — I739 Peripheral vascular disease, unspecified: Secondary | ICD-10-CM

## 2023-06-18 NOTE — Progress Notes (Unsigned)
MRN : [id number]  Elizabeth Reed is a 77 y.o. ([birth year]) female who presents with chief complaint of legs hurt and swell.  History of Present Illness: ***  Current Meds  Medication Sig   ammonium lactate (LAC-HYDRIN) 12 % lotion Apply 1 Application topically 2 (two) times daily.   atenolol (TENORMIN) 25 MG tablet Take 25 mg by mouth daily.   Coenzyme Q10 (COQ10) 200 MG CAPS Take 200 mg by mouth daily.    doxycycline (VIBRAMYCIN) 100 MG capsule Take 100 mg by mouth 2 (two) times daily.   ergocalciferol (VITAMIN D2) 50000 UNITS capsule Take 50,000 Units by mouth every 14 (fourteen) days. In the morning on every other Friday   Multiple Vitamin (MULTIVITAMIN WITH MINERALS) TABS tablet Take 1 tablet by mouth daily.   pantoprazole (PROTONIX) 40 MG tablet Take 40 mg by mouth daily.    Probiotic Product (PROBIOTIC DAILY PO) Take 1 capsule by mouth at bedtime. 5 billion active cultures    traMADol (ULTRAM) 50 MG tablet Take 50 mg by mouth every 6 (six) hours as needed.   UNABLE TO FIND Place 2 drops into both eyes 2 (two) times daily. Blink eye drops   UNABLE TO FIND Super beets    Past Medical History:  Diagnosis Date   Anemia    Arthritis    Blood clot associated with vein wall inflammation    Left Leg   Depression    Diverticulosis    Dyspnea    Environmental and seasonal allergies    GERD (gastroesophageal reflux disease)    erosive esophagus   History of hiatal hernia    Hypertension    Insomnia    Pneumonia    Vitamin D deficiency     Past Surgical History:  Procedure Laterality Date   ABDOMINAL HYSTERECTOMY     APPENDECTOMY     CATARACT EXTRACTION W/PHACO Left 06/18/2018   Procedure: CATARACT EXTRACTION PHACO AND INTRAOCULAR LENS PLACEMENT (IOC);  Surgeon: Galen Manila, MD;  Location: [facility];  Service: Ophthalmology;  Laterality: Left;  [place]  00:23.8 CDE 3.47 Fluid pack lot # [id number]   CATARACT EXTRACTION W/PHACO Right 07/30/2018   Procedure: CATARACT  EXTRACTION PHACO AND INTRAOCULAR LENS PLACEMENT (IOC) RIGHT;  Surgeon: Galen Manila, MD;  Location: [facility];  Service: Ophthalmology;  Laterality: Right;  [place]  00:37 CDE 5.13 Fluid pack lot # [id number]   CHOLECYSTECTOMY N/A 05/11/2015   Procedure: LAPAROSCOPIC CHOLECYSTECTOMY;  Surgeon: Nadeen Landau, MD;  Location: [facility];  Service: General;  Laterality: N/A;   COLONOSCOPY WITH PROPOFOL N/A 04/02/2017   Procedure: COLONOSCOPY WITH PROPOFOL;  Surgeon: Christena Deem, MD;  Location: [facility];  Service: Endoscopy;  Laterality: N/A;   CORRECTION HAMMER TOE     EYE SURGERY     FOOT NEUROMA SURGERY     JOINT REPLACEMENT Bilateral 2004, 2010   Total Knee Replacement   SHOULDER ARTHROSCOPY W/ ROTATOR CUFF REPAIR Left    stab phlebectomy     TONSILLECTOMY      Social History Social History   Tobacco Use   Smoking status: Never   Smokeless tobacco: Never  Vaping Use   Vaping status: Never Used  Substance Use Topics   Alcohol use: No   Drug use: No    Family History Family History  Problem Relation Age of Onset   Breast cancer Maternal Aunt     Allergies  Allergen Reactions   Hydrochlorothiazide Other (See Comments)  Hyponatremia  Other reaction(s): Other (See Comments)  Hyponatremia     REVIEW OF SYSTEMS (Negative unless checked)  Constitutional: [] Weight loss  [] Fever  [] Chills Cardiac: [] Chest pain   [] Chest pressure   [] Palpitations   [] Shortness of breath when laying flat   [] Shortness of breath with exertion. Vascular:  [] Pain in legs with walking   [x] Pain in legs at rest  [] History of DVT   [] Phlebitis   [x] Swelling in legs   [] Varicose veins   [] Non-healing ulcers Pulmonary:   [] Uses home oxygen   [] Productive cough   [] Hemoptysis   [] Wheeze  [] COPD   [] Asthma Neurologic:  [] Dizziness   [] Seizures   [] History of stroke   [] History of TIA  [] Aphasia   [] Vissual changes   [] Weakness or numbness in arm   [] Weakness or numbness in  leg Musculoskeletal:   [] Joint swelling   [] Joint pain   [] Low back pain Hematologic:  [] Easy bruising  [] Easy bleeding   [] Hypercoagulable state   [] Anemic Gastrointestinal:  [] Diarrhea   [] Vomiting  [] Gastroesophageal reflux/heartburn   [] Difficulty swallowing. Genitourinary:  [] Chronic kidney disease   [] Difficult urination  [] Frequent urination   [] Blood in urine Skin:  [] Rashes   [] Ulcers  Psychological:  [] History of anxiety   []  History of major depression.  Physical Examination  Vitals:   06/18/23 0953  BP: (!) 179/86  Pulse: 64  Resp: 16  Weight: 200 lb 6.4 oz (90.9 kg)   Body mass index is 32.35 kg/m. Gen: WD/WN, NAD Head: [place]/AT, No temporalis wasting.  Ear/Nose/Throat: Hearing grossly intact, nares w/o erythema or drainage, pinna without lesions Eyes: PER, EOMI, sclera nonicteric.  Neck: Supple, no gross masses.  No JVD.  Pulmonary:  Good air movement, no audible wheezing, no use of accessory muscles.  Cardiac: RRR, precordium not hyperdynamic. Vascular:  scattered varicosities present bilaterally.  Moderate venous stasis changes to the legs bilaterally.  2+ soft pitting edema. CEAP C4sEpAsPr   Vessel Right Left  Radial Palpable Palpable  Gastrointestinal: soft, non-distended. No guarding/no peritoneal signs.  Musculoskeletal: M/S 5/5 throughout.  No deformity.  Neurologic: CN 2-12 intact. Pain and light touch intact in extremities.  Symmetrical.  Speech is fluent. Motor exam as listed above. Psychiatric: Judgment intact, Mood & affect appropriate for pt's clinical situation. Dermatologic: Venous rashes no ulcers noted.  No changes consistent with cellulitis. Lymph : No lichenification or skin changes of chronic lymphedema.  CBC No results found for: "WBC", "HGB", "HCT", "MCV", "PLT"  BMET    Component Value Date/Time   K 4.2 05/04/2015 1221   CrCl cannot be calculated (No successful lab value found.).  COAG No results found for: "INR",  "PROTIME"  Radiology US Venous Img Lower Unilateral Left (DVT)  Result Date: 05/28/2023 CLINICAL DATA:  Left lower leg pain for 2 months EXAM: LEFT LOWER EXTREMITY VENOUS DOPPLER ULTRASOUND TECHNIQUE: Gray-scale sonography with graded compression, as well as color Doppler and duplex ultrasound were performed to evaluate the lower extremity deep venous systems from the level of the common femoral vein and including the common femoral, femoral, profunda femoral, popliteal and calf veins including the posterior tibial, peroneal and gastrocnemius veins when visible. Spectral Doppler was utilized to evaluate flow at rest and with distal augmentation maneuvers in the common femoral, femoral and popliteal veins. COMPARISON:  None Available. FINDINGS: Contralateral Common Femoral Vein: Respiratory phasicity is normal and symmetric with the symptomatic side. No evidence of thrombus. Normal compressibility. Common Femoral Vein: No evidence of thrombus. Normal compressibility, respiratory  phasicity and response to augmentation. Saphenofemoral Junction: No evidence of thrombus. Normal compressibility and flow on color Doppler imaging. Profunda Femoral Vein: No evidence of thrombus. Normal compressibility and flow on color Doppler imaging. Femoral Vein: No evidence of thrombus. Normal compressibility, respiratory phasicity and response to augmentation. Popliteal Vein: No evidence of thrombus. Normal compressibility, respiratory phasicity and response to augmentation. Calf Veins: Limited visualization of the calf veins. No definite evidence of thrombus. Normal compressibility and flow on color Doppler imaging. IMPRESSION: No evidence of significant left lower extremity deep venous thrombosis. Electronically Signed   By: Judie Petit.  Shick M.D.   On: 05/28/2023 14:47     Assessment/Plan There are no diagnoses linked to this encounter.   Levora Dredge, MD  06/18/2023 10:04 AM

## 2023-06-19 ENCOUNTER — Encounter (INDEPENDENT_AMBULATORY_CARE_PROVIDER_SITE_OTHER): Payer: Self-pay | Admitting: Vascular Surgery

## 2023-06-19 DIAGNOSIS — I739 Peripheral vascular disease, unspecified: Secondary | ICD-10-CM | POA: Insufficient documentation

## 2023-06-19 DIAGNOSIS — I872 Venous insufficiency (chronic) (peripheral): Secondary | ICD-10-CM | POA: Insufficient documentation

## 2023-06-19 DIAGNOSIS — I890 Lymphedema, not elsewhere classified: Secondary | ICD-10-CM | POA: Insufficient documentation

## 2023-06-19 DIAGNOSIS — I10 Essential (primary) hypertension: Secondary | ICD-10-CM | POA: Insufficient documentation

## 2023-07-23 DIAGNOSIS — I872 Venous insufficiency (chronic) (peripheral): Secondary | ICD-10-CM | POA: Diagnosis not present

## 2023-07-23 DIAGNOSIS — Z9889 Other specified postprocedural states: Secondary | ICD-10-CM | POA: Diagnosis not present

## 2023-07-23 DIAGNOSIS — N1831 Chronic kidney disease, stage 3a: Secondary | ICD-10-CM | POA: Diagnosis not present

## 2023-07-23 DIAGNOSIS — K219 Gastro-esophageal reflux disease without esophagitis: Secondary | ICD-10-CM | POA: Diagnosis not present

## 2023-07-23 DIAGNOSIS — N184 Chronic kidney disease, stage 4 (severe): Secondary | ICD-10-CM | POA: Diagnosis not present

## 2023-07-23 DIAGNOSIS — E7800 Pure hypercholesterolemia, unspecified: Secondary | ICD-10-CM | POA: Diagnosis not present

## 2023-07-23 DIAGNOSIS — I272 Pulmonary hypertension, unspecified: Secondary | ICD-10-CM | POA: Diagnosis not present

## 2023-07-23 DIAGNOSIS — I10 Essential (primary) hypertension: Secondary | ICD-10-CM | POA: Diagnosis not present

## 2023-07-26 DIAGNOSIS — R8290 Unspecified abnormal findings in urine: Secondary | ICD-10-CM | POA: Diagnosis not present

## 2023-08-16 ENCOUNTER — Other Ambulatory Visit (INDEPENDENT_AMBULATORY_CARE_PROVIDER_SITE_OTHER): Payer: Self-pay | Admitting: Vascular Surgery

## 2023-08-16 DIAGNOSIS — I739 Peripheral vascular disease, unspecified: Secondary | ICD-10-CM

## 2023-08-19 NOTE — Progress Notes (Unsigned)
MRN : [id number]  Elizabeth Reed is a 78 y.o. ([birth year]) female who presents with chief complaint of legs swell.  History of Present Illness:   The patient returns to the office for followup evaluation regarding leg swelling.  The swelling has persisted and the pain associated with swelling continues. There have not been any interval development of a ulcerations or wounds.  Since the previous visit the patient has been wearing graduated compression stockings and has noted little if any improvement in the lymphedema. The patient has been using compression routinely morning until night.  The patient also states elevation during the day and exercise is being done too.  No outpatient medications have been marked as taking for the 08/20/23 encounter (Appointment) with Gilda Crease, Latina Craver, MD.    Past Medical History:  Diagnosis Date   Anemia    Arthritis    Blood clot associated with vein wall inflammation    Left Leg   Depression    Diverticulosis    Dyspnea    Environmental and seasonal allergies    GERD (gastroesophageal reflux disease)    erosive esophagus   History of hiatal hernia    Hypertension    Insomnia    Pneumonia    Vitamin D deficiency     Past Surgical History:  Procedure Laterality Date   ABDOMINAL HYSTERECTOMY     APPENDECTOMY     CATARACT EXTRACTION W/PHACO Left 06/18/2018   Procedure: CATARACT EXTRACTION PHACO AND INTRAOCULAR LENS PLACEMENT (IOC);  Surgeon: Galen Manila, MD;  Location: [facility];  Service: Ophthalmology;  Laterality: Left;  [place]  00:23.8 CDE 3.47 Fluid pack lot # [id number]   CATARACT EXTRACTION W/PHACO Right 07/30/2018   Procedure: CATARACT EXTRACTION PHACO AND INTRAOCULAR LENS PLACEMENT (IOC) RIGHT;  Surgeon: Galen Manila, MD;  Location: [facility];  Service: Ophthalmology;  Laterality: Right;  [place]  00:37 CDE 5.13 Fluid pack lot # [id number]   CHOLECYSTECTOMY N/A 05/11/2015   Procedure: LAPAROSCOPIC  CHOLECYSTECTOMY;  Surgeon: Nadeen Landau, MD;  Location: [facility];  Service: General;  Laterality: N/A;   COLONOSCOPY WITH PROPOFOL N/A 04/02/2017   Procedure: COLONOSCOPY WITH PROPOFOL;  Surgeon: Christena Deem, MD;  Location: [facility];  Service: Endoscopy;  Laterality: N/A;   CORRECTION HAMMER TOE     EYE SURGERY     FOOT NEUROMA SURGERY     JOINT REPLACEMENT Bilateral 2004, 2010   Total Knee Replacement   SHOULDER ARTHROSCOPY W/ ROTATOR CUFF REPAIR Left    stab phlebectomy     TONSILLECTOMY      Social History Social History   Tobacco Use   Smoking status: Never   Smokeless tobacco: Never  Vaping Use   Vaping status: Never Used  Substance Use Topics   Alcohol use: No   Drug use: No    Family History Family History  Problem Relation Age of Onset   Breast cancer Maternal Aunt     Allergies  Allergen Reactions   Hydrochlorothiazide Other (See Comments)    Hyponatremia  Other reaction(s): Other (See Comments)  Hyponatremia     REVIEW OF SYSTEMS (Negative unless checked)  Constitutional: [] Weight loss  [] Fever  [] Chills Cardiac: [] Chest pain   [] Chest pressure   [] Palpitations   [] Shortness of breath when laying flat   [] Shortness of breath with exertion. Vascular:  [] Pain  in legs with walking   [x] Pain in legs with standing  [] History of DVT   [] Phlebitis   [x] Swelling in legs   [] Varicose veins   [] Non-healing ulcers Pulmonary:   [] Uses home oxygen   [] Productive cough   [] Hemoptysis   [] Wheeze  [] COPD   [] Asthma Neurologic:  [] Dizziness   [] Seizures   [] History of stroke   [] History of TIA  [] Aphasia   [] Vissual changes   [] Weakness or numbness in arm   [] Weakness or numbness in leg Musculoskeletal:   [] Joint swelling   [] Joint pain   [] Low back pain Hematologic:  [] Easy bruising  [] Easy bleeding   [] Hypercoagulable state   [] Anemic Gastrointestinal:  [] Diarrhea   [] Vomiting  [] Gastroesophageal reflux/heartburn   [] Difficulty  swallowing. Genitourinary:  [] Chronic kidney disease   [] Difficult urination  [] Frequent urination   [] Blood in urine Skin:  [] Rashes   [] Ulcers  Psychological:  [] History of anxiety   []  History of major depression.  Physical Examination  There were no vitals filed for this visit. There is no height or weight on file to calculate BMI. Gen: WD/WN, NAD Head: /AT, No temporalis wasting.  Ear/Nose/Throat: Hearing grossly intact, nares w/o erythema or drainage, pinna without lesions Eyes: PER, EOMI, sclera nonicteric.  Neck: Supple, no gross masses.  No JVD.  Pulmonary:  Good air movement, no audible wheezing, no use of accessory muscles.  Cardiac: RRR, precordium not hyperdynamic. Vascular:  scattered varicosities present bilaterally.  Mild venous stasis changes to the legs bilaterally.  3-4+ soft pitting edema, CEAP C4sEpAsPr  Vessel Right Left  Radial Palpable Palpable  Gastrointestinal: soft, non-distended. No guarding/no peritoneal signs.  Musculoskeletal: M/S 5/5 throughout.  No deformity.  Neurologic: CN 2-12 intact. Pain and light touch intact in extremities.  Symmetrical.  Speech is fluent. Motor exam as listed above. Psychiatric: Judgment intact, Mood & affect appropriate for pt's clinical situation. Dermatologic: Venous rashes no ulcers noted.  No changes consistent with cellulitis. Lymph : No lichenification or skin changes of chronic lymphedema.  CBC No results found for: "WBC", "HGB", "HCT", "MCV", "PLT"  BMET    Component Value Date/Time   K 4.2 05/04/2015 1221   CrCl cannot be calculated (No successful lab value found.).  COAG No results found for: "INR", "PROTIME"  Radiology No results found.   Assessment/Plan There are no diagnoses linked to this encounter.   Levora Dredge, MD  08/19/2023 3:55 PM

## 2023-08-20 ENCOUNTER — Encounter (INDEPENDENT_AMBULATORY_CARE_PROVIDER_SITE_OTHER): Payer: Self-pay | Admitting: Vascular Surgery

## 2023-08-20 ENCOUNTER — Ambulatory Visit (INDEPENDENT_AMBULATORY_CARE_PROVIDER_SITE_OTHER): Payer: PPO

## 2023-08-20 ENCOUNTER — Ambulatory Visit (INDEPENDENT_AMBULATORY_CARE_PROVIDER_SITE_OTHER): Payer: PPO | Admitting: Vascular Surgery

## 2023-08-20 VITALS — BP 178/98 | HR 62 | Resp 18 | Ht 62.0 in | Wt 200.0 lb

## 2023-08-20 DIAGNOSIS — I872 Venous insufficiency (chronic) (peripheral): Secondary | ICD-10-CM

## 2023-08-20 DIAGNOSIS — I10 Essential (primary) hypertension: Secondary | ICD-10-CM | POA: Diagnosis not present

## 2023-08-20 DIAGNOSIS — I739 Peripheral vascular disease, unspecified: Secondary | ICD-10-CM

## 2023-08-20 DIAGNOSIS — I890 Lymphedema, not elsewhere classified: Secondary | ICD-10-CM

## 2023-08-20 LAB — VAS US ABI WITH/WO TBI
Left ABI: 1.06
Right ABI: 1.05

## 2023-08-21 ENCOUNTER — Encounter (INDEPENDENT_AMBULATORY_CARE_PROVIDER_SITE_OTHER): Payer: Self-pay | Admitting: Vascular Surgery

## 2023-09-07 DIAGNOSIS — I10 Essential (primary) hypertension: Secondary | ICD-10-CM | POA: Diagnosis not present

## 2023-09-20 DIAGNOSIS — I272 Pulmonary hypertension, unspecified: Secondary | ICD-10-CM | POA: Diagnosis not present

## 2023-09-20 DIAGNOSIS — K219 Gastro-esophageal reflux disease without esophagitis: Secondary | ICD-10-CM | POA: Diagnosis not present

## 2023-09-20 DIAGNOSIS — E7800 Pure hypercholesterolemia, unspecified: Secondary | ICD-10-CM | POA: Diagnosis not present

## 2023-09-20 DIAGNOSIS — N1831 Chronic kidney disease, stage 3a: Secondary | ICD-10-CM | POA: Diagnosis not present

## 2023-09-20 DIAGNOSIS — I10 Essential (primary) hypertension: Secondary | ICD-10-CM | POA: Diagnosis not present

## 2023-09-27 DIAGNOSIS — I272 Pulmonary hypertension, unspecified: Secondary | ICD-10-CM | POA: Diagnosis not present

## 2023-09-27 DIAGNOSIS — N184 Chronic kidney disease, stage 4 (severe): Secondary | ICD-10-CM | POA: Diagnosis not present

## 2023-09-27 DIAGNOSIS — I872 Venous insufficiency (chronic) (peripheral): Secondary | ICD-10-CM | POA: Diagnosis not present

## 2023-09-27 DIAGNOSIS — E7800 Pure hypercholesterolemia, unspecified: Secondary | ICD-10-CM | POA: Diagnosis not present

## 2023-09-27 DIAGNOSIS — N1831 Chronic kidney disease, stage 3a: Secondary | ICD-10-CM | POA: Diagnosis not present

## 2023-09-27 DIAGNOSIS — I10 Essential (primary) hypertension: Secondary | ICD-10-CM | POA: Diagnosis not present

## 2023-11-08 DIAGNOSIS — N1831 Chronic kidney disease, stage 3a: Secondary | ICD-10-CM | POA: Diagnosis not present

## 2023-11-08 DIAGNOSIS — Z0000 Encounter for general adult medical examination without abnormal findings: Secondary | ICD-10-CM | POA: Diagnosis not present

## 2023-11-08 DIAGNOSIS — I10 Essential (primary) hypertension: Secondary | ICD-10-CM | POA: Diagnosis not present

## 2023-11-08 DIAGNOSIS — K219 Gastro-esophageal reflux disease without esophagitis: Secondary | ICD-10-CM | POA: Diagnosis not present

## 2023-11-08 DIAGNOSIS — I872 Venous insufficiency (chronic) (peripheral): Secondary | ICD-10-CM | POA: Diagnosis not present

## 2023-11-08 DIAGNOSIS — E7800 Pure hypercholesterolemia, unspecified: Secondary | ICD-10-CM | POA: Diagnosis not present

## 2023-11-08 DIAGNOSIS — I272 Pulmonary hypertension, unspecified: Secondary | ICD-10-CM | POA: Diagnosis not present

## 2023-11-08 DIAGNOSIS — F5101 Primary insomnia: Secondary | ICD-10-CM | POA: Diagnosis not present

## 2023-12-04 ENCOUNTER — Encounter (INDEPENDENT_AMBULATORY_CARE_PROVIDER_SITE_OTHER): Payer: Self-pay

## 2024-04-23 DIAGNOSIS — Z961 Presence of intraocular lens: Secondary | ICD-10-CM | POA: Diagnosis not present

## 2024-04-23 DIAGNOSIS — H35372 Puckering of macula, left eye: Secondary | ICD-10-CM | POA: Diagnosis not present

## 2024-04-23 DIAGNOSIS — G43109 Migraine with aura, not intractable, without status migrainosus: Secondary | ICD-10-CM | POA: Diagnosis not present

## 2024-04-23 DIAGNOSIS — H43813 Vitreous degeneration, bilateral: Secondary | ICD-10-CM | POA: Diagnosis not present

## 2024-05-01 DIAGNOSIS — I10 Essential (primary) hypertension: Secondary | ICD-10-CM | POA: Diagnosis not present

## 2024-05-01 DIAGNOSIS — R5383 Other fatigue: Secondary | ICD-10-CM | POA: Diagnosis not present

## 2024-05-01 DIAGNOSIS — Z79899 Other long term (current) drug therapy: Secondary | ICD-10-CM | POA: Diagnosis not present

## 2024-05-01 DIAGNOSIS — N1831 Chronic kidney disease, stage 3a: Secondary | ICD-10-CM | POA: Diagnosis not present

## 2024-05-01 DIAGNOSIS — E7800 Pure hypercholesterolemia, unspecified: Secondary | ICD-10-CM | POA: Diagnosis not present

## 2024-05-01 DIAGNOSIS — K219 Gastro-esophageal reflux disease without esophagitis: Secondary | ICD-10-CM | POA: Diagnosis not present

## 2024-05-01 DIAGNOSIS — I872 Venous insufficiency (chronic) (peripheral): Secondary | ICD-10-CM | POA: Diagnosis not present

## 2024-05-01 DIAGNOSIS — F5101 Primary insomnia: Secondary | ICD-10-CM | POA: Diagnosis not present

## 2024-05-01 DIAGNOSIS — I272 Pulmonary hypertension, unspecified: Secondary | ICD-10-CM | POA: Diagnosis not present

## 2024-05-01 DIAGNOSIS — Z0000 Encounter for general adult medical examination without abnormal findings: Secondary | ICD-10-CM | POA: Diagnosis not present

## 2024-05-08 DIAGNOSIS — I10 Essential (primary) hypertension: Secondary | ICD-10-CM | POA: Diagnosis not present

## 2024-05-08 DIAGNOSIS — E7800 Pure hypercholesterolemia, unspecified: Secondary | ICD-10-CM | POA: Diagnosis not present

## 2024-05-08 DIAGNOSIS — K219 Gastro-esophageal reflux disease without esophagitis: Secondary | ICD-10-CM | POA: Diagnosis not present

## 2024-05-08 DIAGNOSIS — N1831 Chronic kidney disease, stage 3a: Secondary | ICD-10-CM | POA: Diagnosis not present

## 2024-05-08 DIAGNOSIS — I872 Venous insufficiency (chronic) (peripheral): Secondary | ICD-10-CM | POA: Diagnosis not present
